# Patient Record
Sex: Female | Born: 1980 | Race: White | Hispanic: No | Marital: Single | Smoking: Former smoker
Health system: Southern US, Community
[De-identification: ages and names within clinical notes are randomized; demographics above are authoritative.]

## PROBLEM LIST (undated history)

## (undated) DIAGNOSIS — R87619 Unspecified abnormal cytological findings in specimens from cervix uteri: Secondary | ICD-10-CM

## (undated) DIAGNOSIS — IMO0002 Reserved for concepts with insufficient information to code with codable children: Secondary | ICD-10-CM

## (undated) HISTORY — PX: DILATION AND CURETTAGE OF UTERUS: SHX78

## (undated) HISTORY — PX: WISDOM TOOTH EXTRACTION: SHX21

## (undated) HISTORY — PX: ESOPHAGOGASTRODUODENOSCOPY ENDOSCOPY: SHX5814

---
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
## 2005-05-22 ENCOUNTER — Emergency Department (HOSPITAL_COMMUNITY): Admission: EM | Admit: 2005-05-22 | Discharge: 2005-05-22 | Payer: Self-pay | Admitting: Emergency Medicine

## 2005-06-23 ENCOUNTER — Emergency Department (HOSPITAL_COMMUNITY): Admission: EM | Admit: 2005-06-23 | Discharge: 2005-06-23 | Payer: Self-pay | Admitting: Emergency Medicine

## 2006-06-23 ENCOUNTER — Emergency Department (HOSPITAL_COMMUNITY): Admission: EM | Admit: 2006-06-23 | Discharge: 2006-06-23 | Payer: Self-pay | Admitting: Emergency Medicine

## 2011-01-12 LAB — RPR: RPR: NONREACTIVE

## 2011-01-12 LAB — ANTIBODY SCREEN: Antibody Screen: NEGATIVE

## 2011-01-21 ENCOUNTER — Other Ambulatory Visit (HOSPITAL_COMMUNITY): Payer: Self-pay | Admitting: Obstetrics and Gynecology

## 2011-01-21 DIAGNOSIS — IMO0001 Reserved for inherently not codable concepts without codable children: Secondary | ICD-10-CM

## 2011-01-21 DIAGNOSIS — Z3682 Encounter for antenatal screening for nuchal translucency: Secondary | ICD-10-CM

## 2011-02-22 ENCOUNTER — Other Ambulatory Visit (HOSPITAL_COMMUNITY): Payer: Self-pay | Admitting: Obstetrics and Gynecology

## 2011-02-22 ENCOUNTER — Ambulatory Visit (HOSPITAL_COMMUNITY)
Admission: RE | Admit: 2011-02-22 | Discharge: 2011-02-22 | Disposition: A | Discharge status: Home / Self Care | Payer: Medicaid Other | Source: Ambulatory Visit | Attending: Obstetrics and Gynecology | Admitting: Obstetrics and Gynecology

## 2011-02-22 DIAGNOSIS — O351XX0 Maternal care for (suspected) chromosomal abnormality in fetus, not applicable or unspecified: Secondary | ICD-10-CM | POA: Insufficient documentation

## 2011-02-22 DIAGNOSIS — Z3682 Encounter for antenatal screening for nuchal translucency: Secondary | ICD-10-CM

## 2011-02-22 DIAGNOSIS — O3510X0 Maternal care for (suspected) chromosomal abnormality in fetus, unspecified, not applicable or unspecified: Secondary | ICD-10-CM | POA: Insufficient documentation

## 2011-02-22 DIAGNOSIS — O30009 Twin pregnancy, unspecified number of placenta and unspecified number of amniotic sacs, unspecified trimester: Secondary | ICD-10-CM | POA: Insufficient documentation

## 2011-02-22 DIAGNOSIS — O3440 Maternal care for other abnormalities of cervix, unspecified trimester: Secondary | ICD-10-CM | POA: Insufficient documentation

## 2011-02-22 DIAGNOSIS — IMO0001 Reserved for inherently not codable concepts without codable children: Secondary | ICD-10-CM

## 2011-02-22 DIAGNOSIS — O99330 Smoking (tobacco) complicating pregnancy, unspecified trimester: Secondary | ICD-10-CM | POA: Insufficient documentation

## 2011-02-22 DIAGNOSIS — O2620 Pregnancy care for patient with recurrent pregnancy loss, unspecified trimester: Secondary | ICD-10-CM | POA: Insufficient documentation

## 2011-02-22 DIAGNOSIS — Z8751 Personal history of pre-term labor: Secondary | ICD-10-CM | POA: Insufficient documentation

## 2011-03-11 ENCOUNTER — Other Ambulatory Visit (HOSPITAL_COMMUNITY): Payer: Self-pay | Admitting: Obstetrics and Gynecology

## 2011-03-11 DIAGNOSIS — IMO0002 Reserved for concepts with insufficient information to code with codable children: Secondary | ICD-10-CM

## 2011-03-11 DIAGNOSIS — Z0489 Encounter for examination and observation for other specified reasons: Secondary | ICD-10-CM

## 2011-03-15 ENCOUNTER — Ambulatory Visit (HOSPITAL_COMMUNITY)
Admission: RE | Admit: 2011-03-15 | Discharge: 2011-03-15 | Disposition: A | Discharge status: Home / Self Care | Payer: Medicaid Other | Source: Ambulatory Visit | Attending: Obstetrics and Gynecology | Admitting: Obstetrics and Gynecology

## 2011-03-15 DIAGNOSIS — O30009 Twin pregnancy, unspecified number of placenta and unspecified number of amniotic sacs, unspecified trimester: Secondary | ICD-10-CM | POA: Insufficient documentation

## 2011-03-29 ENCOUNTER — Ambulatory Visit (HOSPITAL_COMMUNITY): Payer: Medicaid Other

## 2011-03-29 ENCOUNTER — Other Ambulatory Visit (HOSPITAL_COMMUNITY): Payer: Self-pay | Admitting: Obstetrics and Gynecology

## 2011-03-29 ENCOUNTER — Ambulatory Visit (HOSPITAL_COMMUNITY)
Admission: RE | Admit: 2011-03-29 | Discharge: 2011-03-29 | Disposition: A | Discharge status: Home / Self Care | Payer: Medicaid Other | Source: Ambulatory Visit | Attending: Obstetrics and Gynecology | Admitting: Obstetrics and Gynecology

## 2011-03-29 DIAGNOSIS — O3440 Maternal care for other abnormalities of cervix, unspecified trimester: Secondary | ICD-10-CM | POA: Insufficient documentation

## 2011-03-29 DIAGNOSIS — O99330 Smoking (tobacco) complicating pregnancy, unspecified trimester: Secondary | ICD-10-CM | POA: Insufficient documentation

## 2011-03-29 DIAGNOSIS — Z0489 Encounter for examination and observation for other specified reasons: Secondary | ICD-10-CM

## 2011-03-29 DIAGNOSIS — O358XX0 Maternal care for other (suspected) fetal abnormality and damage, not applicable or unspecified: Secondary | ICD-10-CM | POA: Insufficient documentation

## 2011-03-29 DIAGNOSIS — O2620 Pregnancy care for patient with recurrent pregnancy loss, unspecified trimester: Secondary | ICD-10-CM | POA: Insufficient documentation

## 2011-03-29 DIAGNOSIS — Z1389 Encounter for screening for other disorder: Secondary | ICD-10-CM | POA: Insufficient documentation

## 2011-03-29 DIAGNOSIS — IMO0002 Reserved for concepts with insufficient information to code with codable children: Secondary | ICD-10-CM

## 2011-03-29 DIAGNOSIS — Z8751 Personal history of pre-term labor: Secondary | ICD-10-CM | POA: Insufficient documentation

## 2011-03-29 DIAGNOSIS — Z363 Encounter for antenatal screening for malformations: Secondary | ICD-10-CM | POA: Insufficient documentation

## 2011-03-29 DIAGNOSIS — O30009 Twin pregnancy, unspecified number of placenta and unspecified number of amniotic sacs, unspecified trimester: Secondary | ICD-10-CM | POA: Insufficient documentation

## 2011-04-01 ENCOUNTER — Ambulatory Visit (HOSPITAL_COMMUNITY): Payer: Medicaid Other

## 2011-05-10 ENCOUNTER — Ambulatory Visit (HOSPITAL_COMMUNITY)
Admission: RE | Admit: 2011-05-10 | Discharge: 2011-05-10 | Disposition: A | Discharge status: Home / Self Care | Payer: Medicaid Other | Source: Ambulatory Visit | Attending: Obstetrics and Gynecology | Admitting: Obstetrics and Gynecology

## 2011-05-10 ENCOUNTER — Other Ambulatory Visit (HOSPITAL_COMMUNITY): Payer: Self-pay | Admitting: Obstetrics and Gynecology

## 2011-05-10 DIAGNOSIS — Z8751 Personal history of pre-term labor: Secondary | ICD-10-CM | POA: Insufficient documentation

## 2011-05-10 DIAGNOSIS — Z0489 Encounter for examination and observation for other specified reasons: Secondary | ICD-10-CM

## 2011-05-10 DIAGNOSIS — O2620 Pregnancy care for patient with recurrent pregnancy loss, unspecified trimester: Secondary | ICD-10-CM | POA: Insufficient documentation

## 2011-05-10 DIAGNOSIS — IMO0002 Reserved for concepts with insufficient information to code with codable children: Secondary | ICD-10-CM

## 2011-05-10 DIAGNOSIS — O99330 Smoking (tobacco) complicating pregnancy, unspecified trimester: Secondary | ICD-10-CM | POA: Insufficient documentation

## 2011-05-10 DIAGNOSIS — O30009 Twin pregnancy, unspecified number of placenta and unspecified number of amniotic sacs, unspecified trimester: Secondary | ICD-10-CM | POA: Insufficient documentation

## 2011-06-07 ENCOUNTER — Ambulatory Visit (HOSPITAL_COMMUNITY)
Admission: RE | Admit: 2011-06-07 | Discharge: 2011-06-07 | Disposition: A | Discharge status: Home / Self Care | Payer: Medicaid Other | Source: Ambulatory Visit | Attending: Obstetrics and Gynecology | Admitting: Obstetrics and Gynecology

## 2011-06-07 ENCOUNTER — Encounter (HOSPITAL_COMMUNITY): Payer: Self-pay

## 2011-06-07 VITALS — BP 101/68 | HR 98 | Wt 184.0 lb

## 2011-06-07 DIAGNOSIS — Z0489 Encounter for examination and observation for other specified reasons: Secondary | ICD-10-CM

## 2011-06-07 DIAGNOSIS — O2620 Pregnancy care for patient with recurrent pregnancy loss, unspecified trimester: Secondary | ICD-10-CM | POA: Insufficient documentation

## 2011-06-07 DIAGNOSIS — O30009 Twin pregnancy, unspecified number of placenta and unspecified number of amniotic sacs, unspecified trimester: Secondary | ICD-10-CM | POA: Insufficient documentation

## 2011-06-07 DIAGNOSIS — IMO0002 Reserved for concepts with insufficient information to code with codable children: Secondary | ICD-10-CM

## 2011-06-07 NOTE — Progress Notes (Signed)
See ultrasound report in AS  Vital signs reviewed 

## 2011-07-05 ENCOUNTER — Ambulatory Visit (HOSPITAL_COMMUNITY): Payer: Medicaid Other

## 2011-07-05 ENCOUNTER — Other Ambulatory Visit (HOSPITAL_COMMUNITY): Payer: Self-pay | Admitting: Maternal and Fetal Medicine

## 2011-07-05 ENCOUNTER — Ambulatory Visit (HOSPITAL_COMMUNITY)
Admission: RE | Admit: 2011-07-05 | Discharge: 2011-07-05 | Disposition: A | Discharge status: Home / Self Care | Payer: Medicaid Other | Source: Ambulatory Visit | Attending: Obstetrics and Gynecology | Admitting: Obstetrics and Gynecology

## 2011-07-05 VITALS — BP 102/69 | HR 90 | Wt 186.0 lb

## 2011-07-05 DIAGNOSIS — O30009 Twin pregnancy, unspecified number of placenta and unspecified number of amniotic sacs, unspecified trimester: Secondary | ICD-10-CM | POA: Insufficient documentation

## 2011-07-05 DIAGNOSIS — IMO0002 Reserved for concepts with insufficient information to code with codable children: Secondary | ICD-10-CM

## 2011-07-05 DIAGNOSIS — Z0489 Encounter for examination and observation for other specified reasons: Secondary | ICD-10-CM

## 2011-07-05 DIAGNOSIS — O99330 Smoking (tobacco) complicating pregnancy, unspecified trimester: Secondary | ICD-10-CM | POA: Insufficient documentation

## 2011-07-05 DIAGNOSIS — O2620 Pregnancy care for patient with recurrent pregnancy loss, unspecified trimester: Secondary | ICD-10-CM | POA: Insufficient documentation

## 2011-07-05 DIAGNOSIS — Z8751 Personal history of pre-term labor: Secondary | ICD-10-CM | POA: Insufficient documentation

## 2011-07-05 NOTE — Progress Notes (Signed)
Vital signs reviewed; see ultrasound report 

## 2011-07-11 ENCOUNTER — Encounter (HOSPITAL_COMMUNITY): Payer: Self-pay

## 2011-07-11 ENCOUNTER — Inpatient Hospital Stay (HOSPITAL_COMMUNITY)
Admission: AD | Admit: 2011-07-11 | Discharge: 2011-07-12 | DRG: 778 | Disposition: A | Discharge status: Home / Self Care | Payer: Medicaid Other | Source: Ambulatory Visit | Attending: Obstetrics and Gynecology | Admitting: Obstetrics and Gynecology

## 2011-07-11 ENCOUNTER — Inpatient Hospital Stay (HOSPITAL_COMMUNITY): Payer: Medicaid Other

## 2011-07-11 DIAGNOSIS — O4700 False labor before 37 completed weeks of gestation, unspecified trimester: Principal | ICD-10-CM | POA: Diagnosis present

## 2011-07-11 DIAGNOSIS — O30009 Twin pregnancy, unspecified number of placenta and unspecified number of amniotic sacs, unspecified trimester: Secondary | ICD-10-CM

## 2011-07-11 DIAGNOSIS — O30049 Twin pregnancy, dichorionic/diamniotic, unspecified trimester: Secondary | ICD-10-CM

## 2011-07-11 HISTORY — DX: Unspecified abnormal cytological findings in specimens from cervix uteri: R87.619

## 2011-07-11 HISTORY — DX: Reserved for concepts with insufficient information to code with codable children: IMO0002

## 2011-07-11 LAB — RAPID URINE DRUG SCREEN, HOSP PERFORMED
Benzodiazepines: NOT DETECTED
Cocaine: NOT DETECTED
Opiates: NOT DETECTED

## 2011-07-11 LAB — CBC
HCT: 31 % — ABNORMAL LOW (ref 36.0–46.0)
Hemoglobin: 10.5 g/dL — ABNORMAL LOW (ref 12.0–15.0)
MCHC: 33.9 g/dL (ref 30.0–36.0)
MCV: 87.8 fL (ref 78.0–100.0)
RDW: 13.9 % (ref 11.5–15.5)

## 2011-07-11 LAB — URINALYSIS, ROUTINE W REFLEX MICROSCOPIC
Leukocytes, UA: NEGATIVE
Nitrite: NEGATIVE
Specific Gravity, Urine: 1.015 (ref 1.005–1.030)
pH: 6 (ref 5.0–8.0)

## 2011-07-11 LAB — WET PREP, GENITAL: Trich, Wet Prep: NONE SEEN

## 2011-07-11 MED ORDER — LACTATED RINGERS IV SOLN
Freq: Once | INTRAVENOUS | Status: AC
  Administered 2011-07-11: 05:00:00 via INTRAVENOUS

## 2011-07-11 MED ORDER — ACETAMINOPHEN 325 MG PO TABS
650.0000 mg | ORAL_TABLET | Freq: Once | ORAL | Status: AC | PRN
  Administered 2011-07-11: 650 mg via ORAL
  Filled 2011-07-11: qty 2

## 2011-07-11 MED ORDER — ACETAMINOPHEN 325 MG PO TABS: 650.0000 mg | ORAL_TABLET | ORAL | Status: DC | PRN

## 2011-07-11 MED ORDER — ZOLPIDEM TARTRATE 10 MG PO TABS
10.0000 mg | ORAL_TABLET | Freq: Every evening | ORAL | Status: DC | PRN
  Administered 2011-07-11: 10 mg via ORAL
  Filled 2011-07-11: qty 1

## 2011-07-11 MED ORDER — CALCIUM CARBONATE ANTACID 500 MG PO CHEW: 2.0000 | CHEWABLE_TABLET | ORAL | Status: DC | PRN

## 2011-07-11 MED ORDER — NIFEDIPINE 10 MG PO CAPS: 20.0000 mg | ORAL_CAPSULE | Freq: Once | ORAL | Status: DC

## 2011-07-11 MED ORDER — NIFEDIPINE 10 MG PO CAPS
10.0000 mg | ORAL_CAPSULE | Freq: Four times a day (QID) | ORAL | Status: DC
  Administered 2011-07-11: 20 mg via ORAL
  Filled 2011-07-11: qty 2

## 2011-07-11 MED ORDER — BETAMETHASONE SOD PHOS & ACET 6 (3-3) MG/ML IJ SUSP
12.0000 mg | INTRAMUSCULAR | Status: AC
  Administered 2011-07-11 – 2011-07-12 (×2): 12 mg via INTRAMUSCULAR
  Filled 2011-07-11 (×2): qty 2

## 2011-07-11 MED ORDER — DOCUSATE SODIUM 100 MG PO CAPS
100.0000 mg | ORAL_CAPSULE | Freq: Every day | ORAL | Status: DC
  Administered 2011-07-12: 100 mg via ORAL
  Filled 2011-07-11: qty 1

## 2011-07-11 MED ORDER — LACTATED RINGERS IV SOLN
INTRAVENOUS | Status: DC
  Administered 2011-07-11 – 2011-07-12 (×3): via INTRAVENOUS

## 2011-07-11 MED ORDER — PRENATAL PLUS 27-1 MG PO TABS
1.0000 | ORAL_TABLET | Freq: Every day | ORAL | Status: DC
  Administered 2011-07-11 – 2011-07-12 (×2): 1 via ORAL
  Filled 2011-07-11 (×2): qty 1

## 2011-07-11 MED ORDER — NIFEDIPINE 10 MG PO CAPS: 10.0000 mg | ORAL_CAPSULE | Freq: Four times a day (QID) | ORAL | Status: DC

## 2011-07-11 NOTE — Progress Notes (Signed)
S: Feels better. Tired.  O: BP 106/66  Pulse 88  Temp(Src) 97.5 F (36.4 C) (Oral)  Resp 18  Ht 5\' 9"  (1.753 m)  Wt 83.008 kg (183 lb)  BMI 27.02 kg/m2  LMP 11/25/2010  [place]: Breech/ vertex;  Nl dopplers; nl fluid; Cx 1.78 cm  FHT: Cat 1 for both, contractions have spaced out  Abd: Nontender  P: Will continue obs in the hospital.  Will give the second dose of BMeth at 7:30 a.m. Tomorrow. Will do a fFN in a.m.  Patient will consider bedrest if she can go home tomorrow.

## 2011-07-11 NOTE — H&P (Signed)
Chief Complaint  Patient presents with  . Contractions   HPI Pt is a 30 y.o. Who presents with c/o uterine contractions every for the past 2 hrs.  Denies ROM or bldg.  Reports normal fetal movement.  States she also had contractions last evening as well.  Reports intercourse earlier this evening.  Currently preg with twin gestation.  Prev hx remarkable for previous preterm delivery x 2, singleton at 31wks and twin preg at 35wks.  Transferring to [facility] and had initial RN interview last week but has not seen provider yet.  Not rxed progesterone during this pregnancy.  Pt has had multiple ultrasounds with MFM at [facility], most recent being 07/05/11 which revealed Twin A breech with nml amniotic fluid, growth at the 24th% and umbilical artery doppler S/D of 4.04/96%ile and Twin B vertex with nml amniotic fluid, growth at the 45th% and umbilical artery doppler S/D of 3.55/88%. 12% discordance.  States she has not had a cervical exam.    OB History    Grav Para Term Preterm Abortions TAB SAB Ect Mult Living   12 2  2   0 9 0 1 3      Past Medical History  Diagnosis Date  . No pertinent past medical history   . Abnormal Pap smear     Past Surgical History  Procedure Date  . Wisdom tooth extraction   . Dilation and curettage of uterus     11weeks demise    No family history on file.  History  Substance Use Topics  . Smoking status: Current Everyday Smoker -- 0.2 packs/day    Types: Cigarettes  . Smokeless tobacco: Not on file  . Alcohol Use: No    Allergies:  Allergies  Allergen Reactions  . Penicillins Hives  . Sulfa Antibiotics Other (See Comments)    Childhood allergy; reaction unknown    Prescriptions prior to admission  Medication Sig Dispense Refill  . flintstones complete (FLINTSTONES) 60 MG chewable tablet Chew 2 tablets by mouth daily.        Marland Kitchen acetaminophen (TYLENOL) 325 MG tablet Take 650 mg by mouth every 6 (six) hours as needed.        . Multiple  Vitamin (MULTIVITAMIN) tablet Take 1 tablet by mouth daily.          Review of Systems  Constitutional: Negative.   HENT: Negative.   Eyes: Negative.   Respiratory: Negative.   Cardiovascular: Negative.   Gastrointestinal: Negative.   Genitourinary: Negative.   Musculoskeletal: Negative.   Skin: Negative.   Neurological: Negative.   Endo/Heme/Allergies: Negative.   Psychiatric/Behavioral: Negative.    Physical Exam   Blood pressure 111/67, pulse 109, temperature 98.3 F (36.8 C), temperature source Oral, resp. rate 20, height 5\' 9"  (1.753 m), weight 83.235 kg (183 lb 8 oz), last menstrual period 11/25/2010.  Physical Exam  Constitutional: She is oriented to person, place, and time. She appears well-developed and well-nourished.  HENT:  Head: Normocephalic and atraumatic.  Right Ear: External ear normal.  Left Ear: External ear normal.  Nose: Nose normal.  Eyes: Pupils are equal, round, and reactive to light.  Neck: Normal range of motion. Neck supple. No thyromegaly present.  Cardiovascular: Normal rate, regular rhythm, normal heart sounds and intact distal pulses.        Neg edema  Respiratory: Effort normal and breath sounds normal.  GI: Soft. Bowel sounds are normal.  Genitourinary: Vagina normal and uterus normal.  Musculoskeletal: Normal  range of motion.  Neurological: She is alert and oriented to person, place, and time. She has normal reflexes.  Skin: Skin is warm and dry.  Psychiatric: She has a normal mood and affect. Her behavior is normal. Judgment and thought content normal.   Twin A baseline FHR 135 with moderate variability present, accels present.  No decels present.  FHR Cat I Twin B baseline FHR 145 with moderate variability present, accels present.  No decels present.  FHR Cat I UCs every 3-7 minutes and mild to palpation, lasting approximately 30-60 seconds.  Ext genitalia: without lesions Vagina: Pink with nml rugae and tone.  Sm amt white d/c in  vault.  No bleeding noted. Cx: Posterior but does not appear friable. Uterus:  Gravid, soft, nontender. SVE:  3cm dilated/50% effaced/PP out of pelvis Brief bedside ultrasound to determine fetal position with Twin A on maternal left, breech presentation.  Twin B maternal right and transverse lie.     MAU Course  Procedures GC/Chl GBS cultures Wet prep U/A  Assessment and Plan  Twin IUP at 32 weeks 4 days Preterm contractions Hx preterm delivery x 2 Hx recurrent losses  Consult with Dr. Stefano Gaul Admit to Antepartum for observation. Course of betamethasone for fetal lung maturity. Ultrasound for BPPs/dopplers and cervical length.

## 2011-07-11 NOTE — ED Provider Notes (Signed)
History     Chief Complaint  Patient presents with  . Contractions   HPI Pt is a 30 y.o. Who presents with c/o uterine contractions every for the past 2 hrs.  Denies ROM or bldg.  Reports normal fetal movement.  States she also had contractions last evening as well.  Reports intercourse earlier this evening.  Currently preg with twin gestation.  Prev hx remarkable for previous preterm delivery x 2, singleton at 31wks and twin preg at 35wks.  Transferring to [facility] and had initial RN interview last week but has not seen provider yet.  Not rxed progesterone during this pregnancy.  Pt has had multiple ultrasounds with MFM at [facility], most recent being 07/05/11 which revealed Twin A breech with nml amniotic fluid, growth at the 24th% and umbilical artery doppler S/D of 4.04/96%ile and Twin B vertex with nml amniotic fluid, growth at the 45th% and umbilical artery doppler S/D of 3.55/88%. 12% discordance.  States she has not had a cervical exam.    OB History    Grav Para Term Preterm Abortions TAB SAB Ect Mult Living   8 2  2 5  0 5 0 1 3      Past Medical History  Diagnosis Date  . No pertinent past medical history   . Abnormal Pap smear     Past Surgical History  Procedure Date  . Wisdom tooth extraction   . Dilation and curettage of uterus     11weeks demise    No family history on file.  History  Substance Use Topics  . Smoking status: Current Everyday Smoker -- 0.2 packs/day    Types: Cigarettes  . Smokeless tobacco: Not on file  . Alcohol Use: No    Allergies:  Allergies  Allergen Reactions  . Penicillins Hives  . Sulfa Antibiotics Other (See Comments)    Childhood allergy; reaction unknown    Prescriptions prior to admission  Medication Sig Dispense Refill  . flintstones complete (FLINTSTONES) 60 MG chewable tablet Chew 2 tablets by mouth daily.        Marland Kitchen acetaminophen (TYLENOL) 325 MG tablet Take 650 mg by mouth every 6 (six) hours as needed.        .  Multiple Vitamin (MULTIVITAMIN) tablet Take 1 tablet by mouth daily.          Review of Systems  Constitutional: Negative.   HENT: Negative.   Eyes: Negative.   Respiratory: Negative.   Cardiovascular: Negative.   Gastrointestinal: Negative.   Genitourinary: Negative.   Musculoskeletal: Negative.   Skin: Negative.   Neurological: Negative.   Endo/Heme/Allergies: Negative.   Psychiatric/Behavioral: Negative.    Physical Exam   Blood pressure 111/67, pulse 109, temperature 98.3 F (36.8 C), temperature source Oral, resp. rate 20, height 5\' 9"  (1.753 m), weight 83.235 kg (183 lb 8 oz), last menstrual period 11/25/2010.  Physical Exam  Constitutional: She is oriented to person, place, and time. She appears well-developed and well-nourished.  HENT:  Head: Normocephalic and atraumatic.  Right Ear: External ear normal.  Left Ear: External ear normal.  Nose: Nose normal.  Eyes: Pupils are equal, round, and reactive to light.  Neck: Normal range of motion. Neck supple. No thyromegaly present.  Cardiovascular: Normal rate, regular rhythm, normal heart sounds and intact distal pulses.        Neg edema  Respiratory: Effort normal and breath sounds normal.  GI: Soft. Bowel sounds are normal.  Genitourinary: Vagina normal and uterus normal.  Musculoskeletal: Normal range of motion.  Neurological: She is alert and oriented to person, place, and time. She has normal reflexes.  Skin: Skin is warm and dry.  Psychiatric: She has a normal mood and affect. Her behavior is normal. Judgment and thought content normal.   Twin A baseline FHR 135 with moderate variability present, accels present.  No decels present.  FHR Cat I Twin B baseline FHR 145 with moderate variability present, accels present.  No decels present.  FHR Cat I UCs every 3-7 minutes and mild to palpation, lasting approximately 30-60 seconds.  Ext genitalia: without lesions Vagina: Pink with nml rugae and tone.  Sm amt white  d/c in vault.  No bleeding noted. Cx: Posterior but does not appear friable. Uterus:  Gravid, soft, nontender. SVE:  3cm dilated/50% effaced/PP out of pelvis Brief bedside ultrasound to determine fetal position with Twin A on maternal left, breech presentation.  Twin B maternal right and transverse lie.     MAU Course  Procedures GC/Chl GBS cultures Wet prep U/A  Assessment and Plan  Twin IUP at 32 weeks 4 days Preterm contractions Hx preterm delivery x 2 Hx recurrent losses  Consult with Dr. Stefano Gaul Admit to Antepartum for observation. Course of betamethasone for fetal lung maturity. Ultrasound for BPPs/dopplers and cervical length.     Linell Meldrum O. 07/11/2011, 5:54 AM

## 2011-07-11 NOTE — Progress Notes (Signed)
Patient is here with c/o contractions that started at 12:45am. She states that she recently changed OB practices [place]. She denies any vaginal bleeding, lof or discharge.

## 2011-07-11 NOTE — Progress Notes (Signed)
S: Doing well. Few contractions. O: Abd: Nontender BP 116/70  Pulse 98  Temp(Src) 97.9 F (36.6 C) (Oral)  Resp 18  Ht 5\' 9"  (1.753 m)  Wt 83.008 kg (183 lb)  BMI 27.02 kg/m2  LMP 11/25/2010  NST: Cat 1 for both  A: Stable  P: BMeth in the morning. fFN in the morning.  NICU consult.

## 2011-07-12 DIAGNOSIS — O30009 Twin pregnancy, unspecified number of placenta and unspecified number of amniotic sacs, unspecified trimester: Secondary | ICD-10-CM

## 2011-07-12 MED ORDER — NIFEDIPINE 10 MG PO CAPS: 10.0000 mg | ORAL_CAPSULE | Freq: Four times a day (QID) | ORAL | Status: DC

## 2011-07-12 NOTE — Progress Notes (Signed)
   Hospital day # 1 twin pregnancy at [redacted]w[redacted]d  S: well, reports good fetal activity      Contractions:none      Vaginal bleeding:none now       Vaginal discharge: no significant change  O: BP 107/62  Pulse 98  Temp(Src) 97.7 F (36.5 C) (Axillary)  Resp 18  Ht 5\' 9"  (1.753 m)  Wt 83.008 kg (183 lb)  BMI 27.02 kg/m2  LMP 11/25/2010      Fetal tracings:reviewed and reassuring      Uterus gravid and non-tender      Extremities: no significant edema and no signs of DVT      VE: 1+/ long/ post/ firm/ high  A: [redacted]w[redacted]d with preterm labor     completely resolved  P: FFN collected      Discharge home on bed rest with Procardia PRN      Keep scheduled appointment Wednesday 07/14/11  [facility] A  MD 07/12/2011 11:47 AM

## 2011-07-12 NOTE — Progress Notes (Signed)
UR chart review completed.  

## 2011-07-12 NOTE — Discharge Summary (Signed)
  ANTENATAL DISCHARGE SUMMARY  Patient ID: Gwendolyn Glenn MRN: [id number] DOB/AGE: January 02, 1981 29 y.o.  Admit date: 07/11/2011 Discharge date: 07/12/2011  Admission Diagnoses: 32wks twin pregnancy with preterm labor  Discharge Diagnoses: same        Discharged Condition: good  Hospital Course: Received Procardia PRN and responded rapidly. Completed steroid course on 07/12/11.  Consults: none and MFM  Treatments: steroids  Disposition: home on full bedrest.   Current Discharge Medication List    START taking these medications   Details  NIFEdipine (PROCARDIA) 10 MG capsule Take 1 capsule (10 mg total) by mouth every 6 (six) hours. Take only as needed if >= 5 contractions per hour Qty: 30 capsule, Refills: 0      CONTINUE these medications which have NOT CHANGED   Details  flintstones complete (FLINTSTONES) 60 MG chewable tablet Chew 2 tablets by mouth daily.      acetaminophen (TYLENOL) 325 MG tablet Take 650 mg by mouth every 6 (six) hours as needed.      Multiple Vitamin (MULTIVITAMIN) tablet Take 1 tablet by mouth daily.           Signed: Esmeralda Arthur, MD MD 07/12/2011, 11:53 AM

## 2011-07-13 LAB — CULTURE, BETA STREP (GROUP B ONLY)

## 2011-07-28 ENCOUNTER — Ambulatory Visit (HOSPITAL_COMMUNITY): Payer: Medicaid Other

## 2011-07-28 ENCOUNTER — Other Ambulatory Visit: Payer: Self-pay | Admitting: Obstetrics and Gynecology

## 2011-08-03 ENCOUNTER — Encounter (HOSPITAL_COMMUNITY): Payer: Self-pay | Admitting: *Deleted

## 2011-08-03 NOTE — Patient Instructions (Signed)
   Your procedure is scheduled on: Thursday Enter through the Main Entrance of [facility] at: 12:30pm Pick up the phone at the desk and dial [phone]  Please call this number if you have any problems the morning of surgery: [phone]  Remember: Do not eat food after midnight  Do not drink clear liquids after:10am Take these medicines the morning of surgery with a SIP OF WATER:  Do not wear jewelry, make-up, or FINGER nail polish Do not wear lotions, powders, or perfumes. Do not shave 48 hours prior to surgery. Do not bring valuables to the hospital. Leave suitcase in the car. After Surgery it may be brought to your room. For patients being admitted to the hospital, checkout time is 11:00am the day of discharge.  Patients discharged on the day of surgery will not be allowed to drive home.   Name and phone number of your driver:   Remember to use your hibiclens as instructed.

## 2011-08-04 MED ORDER — GENTAMICIN SULFATE 40 MG/ML IJ SOLN
INTRAVENOUS | Status: DC
  Filled 2011-08-04: qty 2.5

## 2011-08-05 ENCOUNTER — Inpatient Hospital Stay (HOSPITAL_COMMUNITY)
Admission: RE | Admit: 2011-08-05 | Discharge: 2011-08-07 | DRG: 765 | Disposition: A | Discharge status: Home / Self Care | Payer: Medicaid Other | Source: Ambulatory Visit | Attending: Obstetrics and Gynecology | Admitting: Obstetrics and Gynecology

## 2011-08-05 ENCOUNTER — Encounter (HOSPITAL_COMMUNITY): Payer: Self-pay | Admitting: *Deleted

## 2011-08-05 ENCOUNTER — Encounter (HOSPITAL_COMMUNITY): Payer: Self-pay | Admitting: Anesthesiology

## 2011-08-05 ENCOUNTER — Encounter (HOSPITAL_COMMUNITY)
Admission: RE | Disposition: A | Discharge status: Home / Self Care | Payer: Self-pay | Source: Ambulatory Visit | Attending: Obstetrics and Gynecology

## 2011-08-05 ENCOUNTER — Inpatient Hospital Stay (HOSPITAL_COMMUNITY): Payer: Medicaid Other | Admitting: Anesthesiology

## 2011-08-05 ENCOUNTER — Other Ambulatory Visit: Payer: Self-pay | Admitting: Obstetrics and Gynecology

## 2011-08-05 DIAGNOSIS — O30049 Twin pregnancy, dichorionic/diamniotic, unspecified trimester: Secondary | ICD-10-CM

## 2011-08-05 DIAGNOSIS — IMO0002 Reserved for concepts with insufficient information to code with codable children: Secondary | ICD-10-CM | POA: Diagnosis present

## 2011-08-05 DIAGNOSIS — O329XX0 Maternal care for malpresentation of fetus, unspecified, not applicable or unspecified: Principal | ICD-10-CM | POA: Diagnosis present

## 2011-08-05 DIAGNOSIS — O365990 Maternal care for other known or suspected poor fetal growth, unspecified trimester, not applicable or unspecified: Secondary | ICD-10-CM | POA: Diagnosis present

## 2011-08-05 DIAGNOSIS — O30009 Twin pregnancy, unspecified number of placenta and unspecified number of amniotic sacs, unspecified trimester: Secondary | ICD-10-CM | POA: Diagnosis present

## 2011-08-05 DIAGNOSIS — O3090 Multiple gestation, unspecified, unspecified trimester: Principal | ICD-10-CM | POA: Diagnosis present

## 2011-08-05 LAB — CBC
HCT: 33.9 % — ABNORMAL LOW (ref 36.0–46.0)
Hemoglobin: 11.3 g/dL — ABNORMAL LOW (ref 12.0–15.0)
MCHC: 33.3 g/dL (ref 30.0–36.0)
MCV: 85.8 fL (ref 78.0–100.0)
RDW: 14 % (ref 11.5–15.5)
WBC: 13.1 10*3/uL — ABNORMAL HIGH (ref 4.0–10.5)

## 2011-08-05 LAB — SURGICAL PCR SCREEN: MRSA, PCR: NEGATIVE

## 2011-08-05 SURGERY — Surgical Case
Anesthesia: Spinal | Site: Abdomen | Wound class: Clean Contaminated

## 2011-08-05 MED ORDER — IBUPROFEN 600 MG PO TABS
600.0000 mg | ORAL_TABLET | Freq: Four times a day (QID) | ORAL | Status: DC
  Administered 2011-08-05 – 2011-08-07 (×6): 600 mg via ORAL
  Filled 2011-08-05 (×6): qty 1

## 2011-08-05 MED ORDER — FENTANYL CITRATE 0.05 MG/ML IJ SOLN: 25.0000 ug | INTRAMUSCULAR | Status: DC | PRN

## 2011-08-05 MED ORDER — ONDANSETRON HCL 4 MG/2ML IJ SOLN: 4.0000 mg | INTRAMUSCULAR | Status: DC | PRN

## 2011-08-05 MED ORDER — LACTATED RINGERS IV SOLN
INTRAVENOUS | Status: DC | PRN
  Administered 2011-08-05: 12:00:00 via INTRAVENOUS

## 2011-08-05 MED ORDER — ZOLPIDEM TARTRATE 5 MG PO TABS: 5.0000 mg | ORAL_TABLET | Freq: Every evening | ORAL | Status: DC | PRN

## 2011-08-05 MED ORDER — PHENYLEPHRINE 40 MCG/ML (10ML) SYRINGE FOR IV PUSH (FOR BLOOD PRESSURE SUPPORT)
PREFILLED_SYRINGE | INTRAVENOUS | Status: AC
  Filled 2011-08-05: qty 5

## 2011-08-05 MED ORDER — NALBUPHINE HCL 10 MG/ML IJ SOLN
5.0000 mg | INTRAMUSCULAR | Status: DC | PRN
  Administered 2011-08-06: 10 mg via SUBCUTANEOUS

## 2011-08-05 MED ORDER — METHYLERGONOVINE MALEATE 0.2 MG/ML IJ SOLN: 0.2000 mg | INTRAMUSCULAR | Status: DC | PRN

## 2011-08-05 MED ORDER — ONDANSETRON HCL 4 MG/2ML IJ SOLN
INTRAMUSCULAR | Status: AC
  Filled 2011-08-05: qty 2

## 2011-08-05 MED ORDER — DIPHENHYDRAMINE HCL 25 MG PO CAPS: 25.0000 mg | ORAL_CAPSULE | ORAL | Status: DC | PRN

## 2011-08-05 MED ORDER — LANOLIN HYDROUS EX OINT: 1.0000 "application " | TOPICAL_OINTMENT | CUTANEOUS | Status: DC | PRN

## 2011-08-05 MED ORDER — KETOROLAC TROMETHAMINE 30 MG/ML IJ SOLN
30.0000 mg | Freq: Four times a day (QID) | INTRAMUSCULAR | Status: AC | PRN
  Administered 2011-08-05: 30 mg via INTRAVENOUS

## 2011-08-05 MED ORDER — SODIUM CHLORIDE 0.9 % IV SOLN
1.0000 ug/kg/h | INTRAVENOUS | Status: DC | PRN
  Filled 2011-08-05: qty 2.5

## 2011-08-05 MED ORDER — DIPHENHYDRAMINE HCL 25 MG PO CAPS: 25.0000 mg | ORAL_CAPSULE | Freq: Four times a day (QID) | ORAL | Status: DC | PRN

## 2011-08-05 MED ORDER — ONDANSETRON HCL 4 MG PO TABS: 4.0000 mg | ORAL_TABLET | ORAL | Status: DC | PRN

## 2011-08-05 MED ORDER — PRENATAL PLUS 27-1 MG PO TABS
1.0000 | ORAL_TABLET | Freq: Every day | ORAL | Status: DC
  Administered 2011-08-07: 1 via ORAL
  Filled 2011-08-05 (×2): qty 1

## 2011-08-05 MED ORDER — METHYLERGONOVINE MALEATE 0.2 MG PO TABS: 0.2000 mg | ORAL_TABLET | ORAL | Status: DC | PRN

## 2011-08-05 MED ORDER — DIPHENHYDRAMINE HCL 50 MG/ML IJ SOLN: 25.0000 mg | INTRAMUSCULAR | Status: DC | PRN

## 2011-08-05 MED ORDER — NALBUPHINE HCL 10 MG/ML IJ SOLN
5.0000 mg | INTRAMUSCULAR | Status: DC | PRN
  Filled 2011-08-05: qty 1

## 2011-08-05 MED ORDER — FENTANYL CITRATE 0.05 MG/ML IJ SOLN
INTRAMUSCULAR | Status: DC | PRN
  Administered 2011-08-05: 12.5 ug via INTRATHECAL

## 2011-08-05 MED ORDER — MORPHINE SULFATE 0.5 MG/ML IJ SOLN
INTRAMUSCULAR | Status: AC
  Filled 2011-08-05: qty 10

## 2011-08-05 MED ORDER — BUPIVACAINE IN DEXTROSE 0.75-8.25 % IT SOLN
INTRATHECAL | Status: DC | PRN
  Administered 2011-08-05: 1.6 mL via INTRATHECAL

## 2011-08-05 MED ORDER — MORPHINE SULFATE (PF) 0.5 MG/ML IJ SOLN
INTRAMUSCULAR | Status: DC | PRN
  Administered 2011-08-05: .2 mg via INTRATHECAL

## 2011-08-05 MED ORDER — LACTATED RINGERS IV SOLN
INTRAVENOUS | Status: DC
  Administered 2011-08-05 (×3): via INTRAVENOUS

## 2011-08-05 MED ORDER — SCOPOLAMINE 1 MG/3DAYS TD PT72: 1.0000 | MEDICATED_PATCH | Freq: Once | TRANSDERMAL | Status: DC

## 2011-08-05 MED ORDER — ONDANSETRON HCL 4 MG/2ML IJ SOLN: 4.0000 mg | Freq: Three times a day (TID) | INTRAMUSCULAR | Status: DC | PRN

## 2011-08-05 MED ORDER — NALOXONE HCL 0.4 MG/ML IJ SOLN: 0.4000 mg | INTRAMUSCULAR | Status: DC | PRN

## 2011-08-05 MED ORDER — FENTANYL CITRATE 0.05 MG/ML IJ SOLN
INTRAMUSCULAR | Status: AC
  Filled 2011-08-05: qty 2

## 2011-08-05 MED ORDER — ACETAMINOPHEN 325 MG PO TABS: 325.0000 mg | ORAL_TABLET | ORAL | Status: DC | PRN

## 2011-08-05 MED ORDER — ACETAMINOPHEN 10 MG/ML IV SOLN: 1000.0000 mg | Freq: Four times a day (QID) | INTRAVENOUS | Status: AC | PRN

## 2011-08-05 MED ORDER — DIPHENHYDRAMINE HCL 50 MG/ML IJ SOLN: 12.5000 mg | INTRAMUSCULAR | Status: DC | PRN

## 2011-08-05 MED ORDER — SENNOSIDES-DOCUSATE SODIUM 8.6-50 MG PO TABS
2.0000 | ORAL_TABLET | Freq: Every day | ORAL | Status: DC
  Administered 2011-08-05 – 2011-08-06 (×2): 2 via ORAL

## 2011-08-05 MED ORDER — WITCH HAZEL-GLYCERIN EX PADS: 1.0000 "application " | MEDICATED_PAD | CUTANEOUS | Status: DC | PRN

## 2011-08-05 MED ORDER — OXYTOCIN 20 UNITS IN LACTATED RINGERS INFUSION - SIMPLE
125.0000 mL/h | INTRAVENOUS | Status: AC
  Administered 2011-08-05: 125 mL/h via INTRAVENOUS
  Filled 2011-08-05: qty 1000

## 2011-08-05 MED ORDER — SIMETHICONE 80 MG PO CHEW
80.0000 mg | CHEWABLE_TABLET | ORAL | Status: DC | PRN
  Administered 2011-08-06: 80 mg via ORAL

## 2011-08-05 MED ORDER — IBUPROFEN 600 MG PO TABS: 600.0000 mg | ORAL_TABLET | Freq: Four times a day (QID) | ORAL | Status: DC | PRN

## 2011-08-05 MED ORDER — ONDANSETRON HCL 4 MG/2ML IJ SOLN
INTRAMUSCULAR | Status: DC | PRN
  Administered 2011-08-05: 4 mg via INTRAVENOUS

## 2011-08-05 MED ORDER — DIBUCAINE 1 % RE OINT: 1.0000 "application " | TOPICAL_OINTMENT | RECTAL | Status: DC | PRN

## 2011-08-05 MED ORDER — SCOPOLAMINE 1 MG/3DAYS TD PT72
MEDICATED_PATCH | TRANSDERMAL | Status: AC
  Administered 2011-08-05: 1.5 mg via TRANSDERMAL
  Filled 2011-08-05: qty 1

## 2011-08-05 MED ORDER — MUPIROCIN 2 % EX OINT
TOPICAL_OINTMENT | CUTANEOUS | Status: AC
  Administered 2011-08-05: 09:00:00 via NASAL
  Filled 2011-08-05: qty 22

## 2011-08-05 MED ORDER — OXYTOCIN 10 UNIT/ML IJ SOLN
INTRAMUSCULAR | Status: AC
  Filled 2011-08-05: qty 2

## 2011-08-05 MED ORDER — OXYCODONE-ACETAMINOPHEN 5-325 MG PO TABS
1.0000 | ORAL_TABLET | ORAL | Status: DC | PRN
  Administered 2011-08-05 – 2011-08-06 (×4): 1 via ORAL
  Administered 2011-08-06: 2 via ORAL
  Administered 2011-08-06 (×2): 1 via ORAL
  Administered 2011-08-07 (×3): 2 via ORAL
  Administered 2011-08-07: 1 via ORAL
  Filled 2011-08-05: qty 1
  Filled 2011-08-05 (×4): qty 2
  Filled 2011-08-05 (×4): qty 1
  Filled 2011-08-05: qty 2
  Filled 2011-08-05 (×2): qty 1

## 2011-08-05 MED ORDER — PHENYLEPHRINE HCL 10 MG/ML IJ SOLN
INTRAMUSCULAR | Status: DC | PRN
  Administered 2011-08-05: 80 ug via INTRAVENOUS
  Administered 2011-08-05: 120 ug via INTRAVENOUS

## 2011-08-05 MED ORDER — FERROUS SULFATE 325 (65 FE) MG PO TABS
325.0000 mg | ORAL_TABLET | Freq: Two times a day (BID) | ORAL | Status: DC
  Administered 2011-08-05 – 2011-08-07 (×3): 325 mg via ORAL
  Filled 2011-08-05 (×4): qty 1

## 2011-08-05 MED ORDER — TETANUS-DIPHTH-ACELL PERTUSSIS 5-2.5-18.5 LF-MCG/0.5 IM SUSP
0.5000 mL | Freq: Once | INTRAMUSCULAR | Status: AC
  Administered 2011-08-07: 0.5 mL via INTRAMUSCULAR
  Filled 2011-08-05: qty 0.5

## 2011-08-05 MED ORDER — PROMETHAZINE HCL 25 MG/ML IJ SOLN: 6.2500 mg | INTRAMUSCULAR | Status: DC | PRN

## 2011-08-05 MED ORDER — BUPIVACAINE HCL (PF) 0.25 % IJ SOLN
INTRAMUSCULAR | Status: DC | PRN
  Administered 2011-08-05: 20 mL

## 2011-08-05 MED ORDER — SIMETHICONE 80 MG PO CHEW
80.0000 mg | CHEWABLE_TABLET | Freq: Three times a day (TID) | ORAL | Status: DC
  Administered 2011-08-05 – 2011-08-07 (×7): 80 mg via ORAL

## 2011-08-05 MED ORDER — SCOPOLAMINE 1 MG/3DAYS TD PT72
1.0000 | MEDICATED_PATCH | Freq: Once | TRANSDERMAL | Status: DC
  Administered 2011-08-05: 1.5 mg via TRANSDERMAL

## 2011-08-05 MED ORDER — MENTHOL 3 MG MT LOZG: 1.0000 | LOZENGE | OROMUCOSAL | Status: DC | PRN

## 2011-08-05 MED ORDER — KETOROLAC TROMETHAMINE 30 MG/ML IJ SOLN: 30.0000 mg | Freq: Four times a day (QID) | INTRAMUSCULAR | Status: AC | PRN

## 2011-08-05 MED ORDER — MEPERIDINE HCL 25 MG/ML IJ SOLN: 6.2500 mg | INTRAMUSCULAR | Status: DC | PRN

## 2011-08-05 MED ORDER — SODIUM CHLORIDE 0.9 % IJ SOLN: 3.0000 mL | INTRAMUSCULAR | Status: DC | PRN

## 2011-08-05 MED ORDER — MEASLES, MUMPS & RUBELLA VAC ~~LOC~~ INJ: 0.5000 mL | INJECTION | Freq: Once | SUBCUTANEOUS | Status: DC

## 2011-08-05 MED ORDER — KETOROLAC TROMETHAMINE 30 MG/ML IJ SOLN
INTRAMUSCULAR | Status: AC
  Administered 2011-08-05: 30 mg via INTRAVENOUS
  Filled 2011-08-05: qty 1

## 2011-08-05 MED ORDER — OXYTOCIN 20 UNITS IN LACTATED RINGERS INFUSION - SIMPLE
INTRAVENOUS | Status: DC | PRN
  Administered 2011-08-05: 20 [IU] via INTRAVENOUS

## 2011-08-05 MED ORDER — METOCLOPRAMIDE HCL 5 MG/ML IJ SOLN: 10.0000 mg | Freq: Three times a day (TID) | INTRAMUSCULAR | Status: DC | PRN

## 2011-08-05 SURGICAL SUPPLY — 35 items
BENZOIN TINCTURE PRP APPL 2/3 (GAUZE/BANDAGES/DRESSINGS) ×2 IMPLANT
BOOTIES KNEE HIGH SLOAN (MISCELLANEOUS) ×4 IMPLANT
CHLORAPREP W/TINT 26ML (MISCELLANEOUS) ×2 IMPLANT
CLOTH BEACON ORANGE TIMEOUT ST (SAFETY) ×2 IMPLANT
DRAIN JACKSON PRT FLT 10 (DRAIN) IMPLANT
DRESSING TELFA 8X3 (GAUZE/BANDAGES/DRESSINGS) ×2 IMPLANT
ELECT REM PT RETURN 9FT ADLT (ELECTROSURGICAL) ×2
ELECTRODE REM PT RTRN 9FT ADLT (ELECTROSURGICAL) ×1 IMPLANT
EVACUATOR SILICONE 100CC (DRAIN) IMPLANT
EXTRACTOR VACUUM M CUP 4 TUBE (SUCTIONS) IMPLANT
GAUZE SPONGE 4X4 12PLY STRL LF (GAUZE/BANDAGES/DRESSINGS) ×4 IMPLANT
GLOVE BIOGEL PI IND STRL 7.0 (GLOVE) ×2 IMPLANT
GLOVE BIOGEL PI INDICATOR 7.0 (GLOVE) ×2
GLOVE ECLIPSE 6.5 STRL STRAW (GLOVE) ×2 IMPLANT
GOWN PREVENTION PLUS LG XLONG (DISPOSABLE) ×6 IMPLANT
KIT ABG SYR 3ML LUER SLIP (SYRINGE) IMPLANT
NEEDLE HYPO 22GX1.5 SAFETY (NEEDLE) ×2 IMPLANT
NEEDLE HYPO 25X5/8 SAFETYGLIDE (NEEDLE) IMPLANT
NS IRRIG 1000ML POUR BTL (IV SOLUTION) ×2 IMPLANT
PACK C SECTION WH (CUSTOM PROCEDURE TRAY) ×2 IMPLANT
PAD ABD 7.5X8 STRL (GAUZE/BANDAGES/DRESSINGS) ×2 IMPLANT
RTRCTR C-SECT PINK 25CM LRG (MISCELLANEOUS) ×2 IMPLANT
SLEEVE SCD COMPRESS KNEE MED (MISCELLANEOUS) IMPLANT
STRIP CLOSURE SKIN 1/2X4 (GAUZE/BANDAGES/DRESSINGS) ×2 IMPLANT
SUT CHROMIC GUT AB #0 18 (SUTURE) IMPLANT
SUT MNCRL AB 3-0 PS2 27 (SUTURE) ×2 IMPLANT
SUT SILK 2 0 FSL 18 (SUTURE) IMPLANT
SUT VIC AB 0 CTX 36 (SUTURE) ×3
SUT VIC AB 0 CTX36XBRD ANBCTRL (SUTURE) ×3 IMPLANT
SUT VIC AB 1 CT1 36 (SUTURE) ×4 IMPLANT
SYR 20CC LL (SYRINGE) ×2 IMPLANT
TOWEL OR 17X24 6PK STRL BLUE (TOWEL DISPOSABLE) ×4 IMPLANT
TRAY FOLEY BAG SILVER LF 14FR (CATHETERS) ×2 IMPLANT
TRAY FOLEY CATH 14FR (SET/KITS/TRAYS/PACK) IMPLANT
WATER STERILE IRR 1000ML POUR (IV SOLUTION) IMPLANT

## 2011-08-05 NOTE — Anesthesia Preprocedure Evaluation (Signed)
Anesthesia Evaluation  Name, MR# and DOB Patient awake  General Assessment Comment  Reviewed: Allergy & Precautions, H&P , NPO status , Patient's Chart, lab work & pertinent test results  Airway Mallampati: II TM Distance: >3 FB Neck ROM: full    Dental No notable dental hx.    Pulmonary  clear to auscultation  pulmonary exam normalPulmonary Exam Normal breath sounds clear to auscultation none    Cardiovascular     Neuro/Psych Negative Neurological ROS  Negative Psych ROS  GI/Hepatic/Renal negative GI ROS  negative Liver ROS  negative Renal ROS        Endo/Other  Negative Endocrine ROS (+)      Abdominal Normal abdominal exam  (+)   Musculoskeletal negative musculoskeletal ROS (+)   Hematology negative hematology ROS (+)   Peds  Reproductive/Obstetrics (+) Pregnancy    Anesthesia Other Findings             Anesthesia Physical Anesthesia Plan  ASA: II  Anesthesia Plan: Spinal   Post-op Pain Management:    Induction:   Airway Management Planned:   Additional Equipment:   Intra-op Plan:   Post-operative Plan:   Informed Consent: I have reviewed the patients History and Physical, chart, labs and discussed the procedure including the risks, benefits and alternatives for the proposed anesthesia with the patient or authorized representative who has indicated his/her understanding and acceptance.     Plan Discussed with:   Anesthesia Plan Comments:         Anesthesia Quick Evaluation

## 2011-08-05 NOTE — Consult Note (Signed)
Neonatology Note:   Attendance at C-section:    I was asked to attend this primary C/S at [redacted] weeks GA due to twin gestation with no growth in Twin A for past 2 weeks. The mother is a [id number] A neg, GBS unknwon with di-di twins, both breech presentation. ROM at delivery, fluid clear. Twin A delivered frank breech and cried spontaneously. He took a few minutes to pink up well. We did bulb suctioning and stimulation. Ap 7/9. His lungs were clear to ausc without distress in the OR. Twin B was delivered footling breech, cried spontaneously, and needed only minimal bulb suctioning. Ap 9/9. Her lungs were clear and she had no distress. To CN to care of Pediatrician.   Deatra James, MD

## 2011-08-05 NOTE — Anesthesia Procedure Notes (Signed)
Spinal Block  Patient location during procedure: OR Start time: 08/05/2011 11:15 AM End time: 08/05/2011 11:19 AM Staffing Anesthesiologist: Sandrea Hughs Performed by: anesthesiologist  Preanesthetic Checklist Completed: patient identified, site marked, surgical consent, pre-op evaluation, timeout performed, IV checked, risks and benefits discussed and monitors and equipment checked Spinal Block Patient position: sitting Prep: DuraPrep Patient monitoring: heart rate, cardiac monitor, continuous pulse ox and blood pressure Approach: midline Location: L3-4 Injection technique: single-shot Needle Needle type: Sprotte  Needle gauge: 24 G Needle length: 9 cm Needle insertion depth: 5 cm Assessment Sensory level: T4

## 2011-08-05 NOTE — Interval H&P Note (Signed)
History and Physical Interval Note:   08/05/2011   10:28 AM   Gwendolyn Glenn  has presented today for surgery, with the diagnosis of Twins; Breech/Vertex @ 37+ weeks  history of preterm deliveries 36 wks baby a iugr was oked by mfm  The various methods of treatment have been discussed with the patient and family. After consideration of risks, benefits and other options for treatment, the patient has consented to  Procedure(s): CESAREAN SECTION as a surgical intervention .  I have reviewed the patients' chart and labs.  Questions were answered to the patient's satisfaction.     Silverio Lay A  MD

## 2011-08-05 NOTE — Transfer of Care (Signed)
Immediate Anesthesia Transfer of Care Note  Patient: Gwendolyn Glenn  Procedure(s) Performed:  CESAREAN SECTION - Primary Cesarean Section with Delivery  Twins  Baby A Boy @ [phone], Baby B Girl @ 1144  Patient Location: PACU  Anesthesia Type: Spinal  Level of Consciousness: awake, alert  and oriented  Airway & Oxygen Therapy: Patient Spontanous Breathing  Post-op Assessment: Report given to PACU RN and Post -op Vital signs reviewed and stable  Post vital signs: Reviewed and stable  Complications: No apparent anesthesia complications

## 2011-08-05 NOTE — Op Note (Signed)
Preoperative diagnosis: Intrauterine twin pregnancy at 36 weeks and 1 days with IUGR on twin A  Post operative diagnosis: Same  Anesthesia: Spinal  Anesthesiologist: Dr. Arby Barrette  Procedure: Primary low transverse cesarean section  Surgeon: Dr. Dois Davenport Marua Qin  Assistant: [name] CNM  Estimated blood loss: 700 cc  Procedure:  After being informed of the planned procedure and possible complications including bleeding, infection, injury to other organs, informed consent is obtained. The patient is taken to OR #1 and given spinal anesthesia without complication. She is placed in the dorsal decubitus position with the pelvis tilted to the left. She is then prepped and draped in a sterile fashion. A Foley catheter is inserted in her bladder.  After assessing adequate level of anesthesia, we infiltrate the suprapubic area with 20 cc of Marcaine 0.25 and perform a Pfannenstiel incision which is brought down sharply to the fascia. The fascia is entered in a low transverse fashion. Linea alba is dissected. Peritoneum is entered in a midline fashion. An Alexis retractor is easily positioned. Visceral peritoneum is entered in a low transverse fashion allowing [place] to safely retract bladder by developing a bladder flap.  The myometrium is then entered in a low transverse fashion; first with knife and then extended bluntly. Amniotic fluid is clear. We assist the birth of a female  infant in frank breech presentation at 11:42 am. Mouth and nose are suctioned. The baby is delivered after reducing a double nuchal cord. The cord is clamped and sectioned. The baby is given to the neonatologist present in the room.We then assist the birth of a female  infant in footling breech presentation at 11:44 am. Mouth and nose are suctioned. The baby is delivered. The cord is clamped and sectioned after reducing a double nuchal cord. The baby is given to the neonatologist present in the room.    10 cc of blood is drawn  from the umbilical vein X 2.The 2 placentas are allowed to deliver spontaneously. Both arecomplete and both  cords have 3 vessels. Uterine revision is negative.  We proceed with closure of the myometrium in 2 layers: First with a running locked suture of 0 Vicryl, then with a Lembert suture of 0 Vicryl imbricating the first one. Hemostasis is completed with cauterization on peritoneal edges and 2 figure-of-eight sutures of 0-Vicryl.  Both paracolic gutters are cleaned. Both tubes and ovaries are assessed and normal. The pelvis is profusely irrigated with warm saline to confirm a satisfactory hemostasis.  Retractors and sponges are removed. Under fascia hemostasis is completed with cauterization. The fascia is then closed with 2 running sutures of 0 Vicryl meeting midline. The wound is irrigated with warm saline and hemostasis is completed with cauterization. The skin is closed with a subcuticular suture of 3-0 Monocryl and Steri-Strips.  Instrument and sponge count is complete x2. Estimated blood loss is 700 cc.  The procedure is well tolerated by the patient who is taken to recovery room in a well and stable condition.  Twin A was a female baby named Caryl Never was born at 11:42 am and received an Apgar of 7  at 1 minute and 9 at 5 minutes. Weight was 4 lbs  15 oz.  Twin B was a female baby named Piper was born at 11:44 am and received an Apgar of 9  at 1 minute and 9 at 5 minutes. Weight was 5 lbs.   Specimen: Placenta X 2 sent to pathology.   WUXLKG,MWNUUV A MD 9/20/201212:43 PM

## 2011-08-05 NOTE — H&P (Signed)
Gwendolyn Glenn, Gwendolyn Glenn              ACCOUNT NO.:  [id number]  MEDICAL RECORD NO.:  [id number]  LOCATION:                                 FACILITY:  PHYSICIAN:  Dois Davenport A. Rivard, M.D. DATE OF BIRTH:  August 17, 1981  DATE OF ADMISSION: DATE OF DISCHARGE:                             HISTORY & PHYSICAL   The patient is a 30 year old gravida 60, para 2-1-9-3 who presents at 55- 1/7 weeks with twin gestation for scheduled primary cesarean section. The patient was seen in the office yesterday and was noted to have no growth in 2 weeks for baby A with a 10% discrepancy in growth, both babies had normal fluid.  Dr. Estanislado Pandy conferred with Dr. Sherrie George and the recommendation was to proceed with C-section on August 05, 2011.  Pregnancy has been remarkable for: 1. Diamniotic dichorionic twins. 2. Preterm delivery times one. 3. Allergy to PENICILLINS which cause hives. 4. Rh negative with RhoGAM received. 5. Transfer from [facility] at 33 weeks. 6. Discordant growth of twin A at approximately 4th percentile with     baby B being at the 15th percentile.  PRENATAL LABS:  Blood type is A negative, Rh antibody negative, VDRL nonreactive, rubella titer positive, hepatitis B surface antigen negative, HIV was nonreactive.  GC and Chlamydia cultures were negative in February.  Urinalysis was also negative in February.  The patient had an elevated glucose challenge.  She did CBGs for 2 weeks and had a normal 3-hour GTT.  No genetic testing or genetic screening is noted in the chart.  Integrated screen and AFP normal.  HISTORY OF PRESENT PREGNANCY:  The patient entered care at [facility] at 33 weeks.  She had been transferred from [facility] physician's.  She had been at [facility] on July 09, 2011, for preterm labor.  She received betamethasone at that time and was using Procardia p.r.n.  Baby A at 33 weeks was breech with normal fluid and normal Dopplers.  Baby B was  transverse, normal Dopplers, and normal fluid.  Cervix was 1.9 cm at that time.  She had been seen by maternal fetal medicine at 31-5/7th weeks, twin A had Dopplers at the 96th percentile with an abdominal circumference lag with discrepancies of growth of A at 24th percentile and B at the 45th percentile at 27 weeks A was at the 36 percentile and B was at the 53rd percentile.  At 23 weeks there had been a low-lying placenta that was resolved.  She did please note to have an integrated screening that was normal and AFP that was normal at that time.  She had anatomy scan at 17 weeks showing normal growth and development with normal cervical length.  She has had an abnormal Glucola at 175.  She did CBGs for 2 weeks which were normal. Her 3-hour GTT after she came to [place] was normal.  At 33 weeks when she had her first visit with Dr. Estanislado Pandy, she was 150%, vertex, -2 and she was maintained on bedrest.  She then had another ultrasound a week after that at 34 weeks showing a 20% discordance between the two with A being breech at the 15th  percentile, B being vertex at the 46th percentile.  Cervix was 2.96 cm long.  She was scheduled for primary C-section initially on August 12, 2011.  She had a reactive NST at 34 weeks when she was seen on August 04, 2011, She was having some increased fatigue.  Her blood pressure was normal. She was having decreased fetal movement. At that time A was noted to be breech with growth at the 4th percentile.  Estimated fetal weight of 4 pounds 13 ounces with a reactive NST with normal fluid and normal Dopplers.  B was transverse at 5 pounds 6 ounces at the 15th percentile with normal fluid and normal Dopplers NST was also reactive on that baby.  She had 10% discrepancy of growth and there had been no growth in 2 weeks for baby A on head circumference.  Dr. Sherrie George was consulted and she recommended C section be moved from August 12, 2011  to August 05, 2011  OBSTETRICAL HISTORY:  The patient had a history of 6 missed AB all before approximately 5 weeks prior to 2000.  In 2000, she had a vaginal birth of a female infant weight 4 pounds at 31 weeks.  She had premature rupture of membranes and preterm delivery in 2004.  She had a TAB at 3 months in 2006 she had twins a female and a female at 35 weeks with 8 hours in labor.  She had epidural anesthesia.  She was induced for those children in 2008 she had a missed AB at 5 weeks in September 2011, she had a missed AB at 11 weeks and her current pregnancy she is pregnant with twins again.  MEDICAL HISTORY:  She used [place] pills, Depo-Provera in the past.  She had abnormal Pap in 2007 but repeat was okay.  She reports usual childhood illnesses.  Surgical history includes a D and C in 2011, wisdom teeth extracted in 2011.  She is allergic to PENICILLIN and SULFA which causes hives.  She has had no previous use of cephalosporins in the past.  She is also sensitive to sulfa which also causes hives.  She has a slight sensitivity to LATEX.  FAMILY HISTORY:  Maternal aunt is hyperthyroid.  Genetic history is remarkable for the patient's children's father and mother.  She has hemophilia.  SOCIAL HISTORY:  The patient is single.  Father of baby is involved and supportive.  His name is Madaline Brilliant.  The patient is Caucasian of the [place] faith.  She has an associate degree and she is working on her bachelor's.  Her partner has high school education some college.  He is a Emergency planning/management officer.  The patient had been followed by the physician service [facility].  She smoked rarely prior to pregnancy and then reports that she stopped during this pregnancy.  PHYSICAL EXAMINATION:  VITAL SIGNS:  Stable.  The patient is febrile. HEENT: Within normal limits. LUNGS:  Breath sounds are clear. HEART:  Regular rate and rhythm without murmur. BREASTS:  Soft and  nontender. ABDOMEN:  Fundal height is approximately 40 cm.  Estimated fetal weights are between 4 and 5 pounds, contractions are very occasional. PELVIC:  Deferred.  Fetal heart rates have been in the 150s by Doppler in the office. EXTREMITIES:  Deep tendon reflexes are 2+ without clonus.  There is 1+ edema noted.  IMPRESSION: 1. Intrauterine twin pregnancy at 36-1/7 weeks. 2. Discordant growth with twin A at the 4th percentile.  Twin B at  15th percentile and no growth of baby A in 2 weeks in head     circumference. 3. Rh negative. 4. PENICILLIN allergy with hives as a reaction. 5. Sporadic smoker. 6. History of preterm delivery x1 with spontaneous onset of labor. 7. Group B streptococcus status unknown.  PLAN: 1. Admit to [facility] per consult with Dr. Silverio Lay as attending physician. 2. Routine physician preoperative orders.     Renaldo Reel Emilee Hero, C.N.M.   ______________________________ Crist Fat Rivard, M.D.    Leeanne Mannan  D:  08/04/2011  T:  08/05/2011  Job:  [id number]

## 2011-08-05 NOTE — Brief Op Note (Signed)
08/05/2011  12:38 PM  PATIENT:  Gwendolyn Glenn  30 y.o. female  PRE-OPERATIVE DIAGNOSIS:  Twins; Breech/Transverse @ 36+1 weeks with IUGR on twin A  POST-OPERATIVE DIAGNOSIS:  same  PROCEDURE:  Procedure(s): CESAREAN SECTION  SURGEON:  Surgeon(s): Esmeralda Arthur, MD MD  ASSISTANTS: [name] CNM   ANESTHESIA:   spinal  LOCAL MEDICATIONS USED:  MARCAINE 20CC  SPECIMEN:  Placenta X 2  DISPOSITION OF SPECIMEN:  PATHOLOGY  COUNTS:  YES  ESTIMATED BLOOD LOSS: 700 cc  PATIENT DISPOSITION:  PACU - hemodynamically stable.       ZOXWRU,EAVWUJ A MD 08/05/2011 12:38 PM

## 2011-08-06 ENCOUNTER — Inpatient Hospital Stay (HOSPITAL_COMMUNITY): Admission: RE | Admit: 2011-08-06 | Discharge: 2011-08-06 | Payer: Medicaid Other | Source: Ambulatory Visit

## 2011-08-06 LAB — CBC
HCT: 29.5 % — ABNORMAL LOW (ref 36.0–46.0)
Platelets: 238 10*3/uL (ref 150–400)
RDW: 14.1 % (ref 11.5–15.5)
WBC: 11.5 10*3/uL — ABNORMAL HIGH (ref 4.0–10.5)

## 2011-08-06 LAB — TYPE AND SCREEN
ABO/RH(D): A NEG
Antibody Screen: POSITIVE

## 2011-08-06 MED ORDER — RHO D IMMUNE GLOBULIN 1500 UNIT/2ML IJ SOLN
300.0000 ug | Freq: Once | INTRAMUSCULAR | Status: AC
  Administered 2011-08-06: 300 ug via INTRAMUSCULAR
  Filled 2011-08-06: qty 2

## 2011-08-06 NOTE — Progress Notes (Signed)
UR chart review completed.  

## 2011-08-06 NOTE — Anesthesia Postprocedure Evaluation (Signed)
  Anesthesia Post-op Note  Patient: Gwendolyn Glenn  Procedure(s) Performed:  CESAREAN SECTION - Primary Cesarean Section with Delivery  Twins  Baby A Boy @ [phone], Baby B Girl @ [phone]  Patient Location: PACU and Mother/Baby  Anesthesia Type: Spinal  Level of Consciousness: awake, alert  and oriented  Airway and Oxygen Therapy: Patient Spontanous Breathing  Post-op Pain: mild  Post-op Assessment: Patient's Cardiovascular Status Stable and Respiratory Function Stable  Post-op Vital Signs: stable  Complications: No apparent anesthesia complications

## 2011-08-06 NOTE — Progress Notes (Signed)
Subjective: Postpartum Day 1: Cesarean Delivery Patient reports no problems voiding.    Objective: Vital signs in last 24 hours: Temp:  [97.6 F (36.4 C)-98.7 F (37.1 C)] 98.3 F (36.8 C) (09/21 0500) Pulse Rate:  [54-75] 75  (09/21 0500) Resp:  [13-23] 18  (09/21 0500) BP: (92-120)/(51-78) 120/78 mmHg (09/21 0500) SpO2:  [97 %-100 %] 97 % (09/21 0500) Weight:  [82.101 kg (181 lb)] 181 lb (82.101 kg) (09/20 1630)  Physical Exam:  General: alert and no distress Lochia: appropriate Uterine Fundus: firm Incision: Dressing is clean DVT Evaluation: No evidence of DVT seen on physical exam.   Basename 08/06/11 0550 08/05/11 0949  HGB 9.7* 11.3*  HCT 29.5* 33.9*    Assessment/Plan: Status post Cesarean section. Doing well postoperatively.  Continue current care.  Garlen Reinig V 08/06/2011, 11:30 AM

## 2011-08-07 LAB — RH IG WORKUP (INCLUDES ABO/RH)
ABO/RH(D): A NEG
Fetal Screen: NEGATIVE
Gestational Age(Wks): 36

## 2011-08-07 MED ORDER — FERROUS SULFATE 325 (65 FE) MG PO TABS: 325.0000 mg | ORAL_TABLET | Freq: Every day | ORAL | Status: DC

## 2011-08-07 MED ORDER — NORETHINDRONE 0.35 MG PO TABS: 1.0000 | ORAL_TABLET | Freq: Every day | ORAL | Status: DC

## 2011-08-07 MED ORDER — OXYCODONE-ACETAMINOPHEN 5-325 MG PO TABS: 1.0000 | ORAL_TABLET | ORAL | Status: AC | PRN

## 2011-08-07 MED ORDER — INFLUENZA VIRUS VACC SPLIT PF IM SUSP
0.5000 mL | Freq: Once | INTRAMUSCULAR | Status: AC
  Administered 2011-08-07: 0.5 mL via INTRAMUSCULAR
  Filled 2011-08-07: qty 0.5

## 2011-08-07 MED ORDER — IBUPROFEN 600 MG PO TABS: 600.0000 mg | ORAL_TABLET | Freq: Four times a day (QID) | ORAL | Status: AC

## 2011-08-07 NOTE — Progress Notes (Addendum)
Subjective:  Postpartum Day 2: Cesarean Delivery Patient reports tolerating PO, + flatus and no problems voiding.  Denies weakness or dizziness.  Reports ambulating without difficulty.  Reports pain is controlled with medications.  Breastfeeding without difficulties.  Thinks she would like to go home today if babies can go.    Objective: Vital signs in last 24 hours: Temp:  [97.5 F (36.4 C)-98.4 F (36.9 C)] 98.4 F (36.9 C) (09/22 0621) Pulse Rate:  [61-71] 71  (09/22 0621) Resp:  [18-19] 18  (09/22 0621) BP: (94-99)/(61-65) 94/61 mmHg (09/22 1610)  Physical Exam:  General: alert, cooperative and no distress Skin warm & dry.  Color satisfactory Heart:  Reg rate/rhythm. Lungs:  Clear to A/P ausc bilat Abdoment:  Soft, nontender with positive bowel sounds x 4 quads. Lochia: appropriate Uterine Fundus: firm, nontender 1 below umbilicus. Incision: Dressing remains present.  No circumferential redness noted. DVT Evaluation: No evidence of DVT seen on physical exam.  Negative Homan's sign bilaterally. No significant calf/ankle edema. DTR's 1+, no clonus   Basename 08/06/11 0550 08/05/11 0949  HGB 9.7* 11.3*  HCT 29.5* 33.9*    Assessment/Plan: Status post Cesarean section. Doing well postoperatively.  Asymptomatic anemia  Continue current care.  Can discharge to home today if babies can go.   Anuj Summons O. 08/07/2011, 10:31 AM   08/07/11 @  1:40 PM Called to see patient for discharge as her infants have been discharged by peds.  Pt states she desires to be discharged home today.   Since previous note, she has been up to shower and dressing has now been removed. Incision clean/dry/intact with steristrips in place.  No redness noted. Physical exam otherwise unchanged.  Desires Micronor for contraception.  Discharge instructions reviewed.  Will discharge to home.  RTO in 6wks for follow up.  To begin Micronor on Sunday 08/22/11.

## 2011-08-07 NOTE — Discharge Summary (Signed)
Obstetric Discharge Summary Reason for Admission: cesarean section and twin pregnancy and intrauterine growth restriction. Prenatal Procedures: NST and ultrasound Intrapartum Procedures: cesarean: low cervical, transverse Postpartum Procedures: none Complications-Operative and Postpartum: none Hemoglobin  Date Value Range Status  08/06/2011 9.7* 12.0-15.0 (g/dL) Final     HCT  Date Value Range Status  08/06/2011 29.5* 36.0-46.0 (%) Final    Discharge Diagnoses: Twin pregnancy at 36 weeks, delivered        Intrauterine growth restriction                                         Breech presentation-Twin A  Discharge Information: Date: 08/07/2011 Activity: unrestricted Diet: routine Medications: PNV, Ibuprophen, Iron and Percocet Condition: stable Instructions: refer to practice specific booklet Discharge to: home Follow-up Information    Follow up with [facility].         Newborn Data:   Ashtin, Melichar [[id number]]  Live born female  Birth Weight: 4 lb 14.8 oz (2234 g) APGAR: 7, 9   Skyy, Nilan [[id number]]  Live born female  Birth Weight: 5 lb 0.1 oz (2270 g) APGAR: 9, 9  Home with mother.  Makaiah Terwilliger O. 08/07/2011, 1:16 PM

## 2011-08-12 ENCOUNTER — Encounter (HOSPITAL_COMMUNITY): Payer: Self-pay | Admitting: Obstetrics and Gynecology

## 2012-07-12 ENCOUNTER — Encounter: Payer: Self-pay | Admitting: Obstetrics and Gynecology

## 2012-10-04 ENCOUNTER — Other Ambulatory Visit: Payer: Self-pay

## 2012-10-04 ENCOUNTER — Telehealth: Payer: Self-pay | Admitting: Obstetrics and Gynecology

## 2012-10-04 ENCOUNTER — Ambulatory Visit: Payer: Medicaid Other

## 2012-10-04 ENCOUNTER — Other Ambulatory Visit: Payer: Self-pay | Admitting: Obstetrics and Gynecology

## 2012-10-04 DIAGNOSIS — Z331 Pregnant state, incidental: Secondary | ICD-10-CM

## 2012-10-04 DIAGNOSIS — O26849 Uterine size-date discrepancy, unspecified trimester: Secondary | ICD-10-CM

## 2012-10-04 NOTE — Progress Notes (Signed)
Pt presented for NOB interview. States is feeling very anxious about this pregnancy. Had +UPT 06/2012.  Had heavy menses 07/2012, followed by weak + UPT.   Had watery pink D/C 08/22/12 x a few days.  +UPT 09/18/12.  Is having lower rt-sided abd pain.  States is Rh neg. Per DR VPH, [facility] and Antibody screen ordered stat.  Pt is unable to return later today for U/S due  to child care. Scheduled for U/S and F/U with DR SR 10/05/12..  Pt advised to call with any bleeding or increased pain. Pt verbalizes comprehension.

## 2012-10-04 NOTE — Telephone Encounter (Signed)
TC to pt.  Per DR VPH informed Gwendolyn Glenn is [id number]. Antibody screen pending.  Informed results are very good.  To keep appt 10/05/12. Call prior with any concerns.

## 2012-10-05 ENCOUNTER — Ambulatory Visit (INDEPENDENT_AMBULATORY_CARE_PROVIDER_SITE_OTHER): Payer: Medicaid Other

## 2012-10-05 ENCOUNTER — Other Ambulatory Visit: Payer: Self-pay | Admitting: Obstetrics and Gynecology

## 2012-10-05 ENCOUNTER — Encounter: Payer: Self-pay | Admitting: Obstetrics and Gynecology

## 2012-10-05 ENCOUNTER — Ambulatory Visit (INDEPENDENT_AMBULATORY_CARE_PROVIDER_SITE_OTHER): Payer: Medicaid Other | Admitting: Obstetrics and Gynecology

## 2012-10-05 VITALS — BP 96/52 | Ht 69.0 in | Wt 152.0 lb

## 2012-10-05 DIAGNOSIS — O26849 Uterine size-date discrepancy, unspecified trimester: Secondary | ICD-10-CM

## 2012-10-05 DIAGNOSIS — Z8759 Personal history of other complications of pregnancy, childbirth and the puerperium: Secondary | ICD-10-CM

## 2012-10-05 DIAGNOSIS — Z8742 Personal history of other diseases of the female genital tract: Secondary | ICD-10-CM

## 2012-10-05 DIAGNOSIS — O3680X0 Pregnancy with inconclusive fetal viability, not applicable or unspecified: Secondary | ICD-10-CM

## 2012-10-05 LAB — ANTIBODY SCREEN: Antibody Screen: NEGATIVE

## 2012-10-05 MED ORDER — ONDANSETRON HCL 8 MG PO TABS: 8.0000 mg | ORAL_TABLET | Freq: Three times a day (TID) | ORAL | Status: DC | PRN

## 2012-10-05 NOTE — Progress Notes (Signed)
Subjective:    Gwendolyn Glenn is a 31 y.o. female, [id number], who presents for Gyn ultrasound because of Pregnancy Dating.  The following portions of the patient's history were reviewed and updated as appropriate: allergies, current medications, past family history.  Objective:    BP 96/52  Ht 5\' 9"  (1.753 m)  Wt 152 lb (68.947 kg)  BMI 22.45 kg/m2  LMP 08/22/2012    Weight:  Wt Readings from Last 1 Encounters:  10/05/12 152 lb (68.947 kg)          BMI: Body mass index is 22.45 kg/(m^2).  ULTRASOUND: Uterus Anteverted    Adnexa Normal    Endometrium n/a    Free fluid: no    Other findings:  Urinary bladder - unremarkable. Singleton pregnancy. Fetal pole and yolk sac seen. + FHTs.  CRL = GA of [redacted]w[redacted]d with an EDD of 05/31/13. Embryonic heart rate is WNLs for a CRL < 5 mm. Cx closed. 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

## 2012-10-09 ENCOUNTER — Telehealth: Payer: Self-pay

## 2012-10-09 LAB — US OB COMP LESS 14 WKS

## 2012-10-09 LAB — US OB TRANSVAGINAL

## 2012-10-09 MED ORDER — NONFORMULARY OR COMPOUNDED ITEM: 25.0000 mg | Freq: Two times a day (BID) | Status: DC

## 2012-10-09 NOTE — Telephone Encounter (Signed)
Message copied by Darien Ramus on Mon Oct 09, 2012 10:30 AM ------      Message from: Silverio Lay      Created: Mon Oct 09, 2012 10:18 AM       Please call pt and start Progesterone vaginal suppository 25 mg BID   #14 / 4R

## 2012-10-09 NOTE — Telephone Encounter (Signed)
Pt advised and Rx sent to [facility] [facility].  Darien Ramus

## 2012-10-10 ENCOUNTER — Telehealth: Payer: Self-pay

## 2012-10-10 NOTE — Telephone Encounter (Signed)
VM from pt to advise that medicaid will not cover Progesterone suppository. Wanting to know if there is something different that she can have in place of this that ins will cover. Called pharmacy and they stated that ins will only pay for oral progesterone. Is it ok to switch to oral? Pt states there is no way that she will be able to pay out of pocket for suppository.  Darien Ramus

## 2012-10-11 MED ORDER — PROGESTERONE MICRONIZED 200 MG PO CAPS: 200.0000 mg | ORAL_CAPSULE | Freq: Every day | ORAL | Status: DC

## 2012-10-11 NOTE — Telephone Encounter (Signed)
Per SR Prometrium 200 mg daily.  Pt advised and voiced understading  Gwendolyn Glenn, Herbert Seta

## 2012-10-16 ENCOUNTER — Telehealth: Payer: Self-pay

## 2012-10-16 NOTE — Telephone Encounter (Signed)
Ok to call in Phenergan 25 mg every 8 hours as needed  #30 / 1 RF

## 2012-10-16 NOTE — Telephone Encounter (Signed)
Returned pt call per VM. Pt wanting to know why progesterone was only sent to pharmacy for 1 week at a time. Advised pt that we did that because we are unsure of how long she may need to take them. Voiced understanding. Pt also states that zofran is not helping with her nausea. Wanting to know if something different can be called to pharmacy? Gwendolyn Glenn

## 2012-10-17 MED ORDER — PROMETHAZINE HCL 50 MG PO TABS: 25.0000 mg | ORAL_TABLET | Freq: Three times a day (TID) | ORAL | Status: DC | PRN

## 2012-10-17 NOTE — Telephone Encounter (Signed)
Pt advised of rx sent to pharmacy Gwendolyn Glenn

## 2012-10-23 ENCOUNTER — Encounter: Payer: Self-pay | Admitting: Obstetrics and Gynecology

## 2012-10-23 ENCOUNTER — Ambulatory Visit (INDEPENDENT_AMBULATORY_CARE_PROVIDER_SITE_OTHER): Payer: Medicaid Other

## 2012-10-23 ENCOUNTER — Ambulatory Visit (INDEPENDENT_AMBULATORY_CARE_PROVIDER_SITE_OTHER): Payer: Medicaid Other | Admitting: Obstetrics and Gynecology

## 2012-10-23 VITALS — BP 94/62 | Wt 162.0 lb

## 2012-10-23 DIAGNOSIS — Z8742 Personal history of other diseases of the female genital tract: Secondary | ICD-10-CM

## 2012-10-23 DIAGNOSIS — Z331 Pregnant state, incidental: Secondary | ICD-10-CM

## 2012-10-23 DIAGNOSIS — O2620 Pregnancy care for patient with recurrent pregnancy loss, unspecified trimester: Secondary | ICD-10-CM

## 2012-10-23 DIAGNOSIS — Z8759 Personal history of other complications of pregnancy, childbirth and the puerperium: Secondary | ICD-10-CM

## 2012-10-23 MED ORDER — CENTRUM SPECIALIST PRENATAL 27-0.8 & 200 MG PO MISC: 1.0000 | Freq: Every morning | ORAL | Status: DC

## 2012-10-23 NOTE — Progress Notes (Signed)
[redacted]w[redacted]d Pt had U/s done today--HX SOB Pt complains of being Nauseated today.

## 2012-10-23 NOTE — Patient Instructions (Signed)
ABCs of Pregnancy  A  Antepartum care is very important. Be sure you see your doctor and get prenatal care as soon as you think you are pregnant. At this time, you will be tested for infection, genetic abnormalities and potential problems with you and the pregnancy. This is the time to discuss diet, exercise, work, medications, labor, pain medication during labor and the possibility of a cesarean delivery. Ask any questions that may concern you. It is important to see your doctor regularly throughout your pregnancy. Avoid exposure to toxic substances and chemicals - such as cleaning solvents, lead and mercury, some insecticides, and paint. Pregnant women should avoid exposure to paint fumes, and fumes that cause you to feel ill, dizzy or faint. When possible, it is a good idea to have a pre-pregnancy consultation with your caregiver to begin some important recommendations your caregiver suggests such as, taking folic acid, exercising, quitting smoking, avoiding alcoholic beverages, etc.  B  Breastfeeding is the healthiest choice for both you and your baby. It has many nutritional benefits for the baby and health benefits for the mother. It also creates a very tight and loving bond between the baby and mother. Talk to your doctor, your family and friends, and your employer about how you choose to feed your baby and how they can support you in your decision. Not all birth defects can be prevented, but a woman can take actions that may increase her chance of having a healthy baby. Many birth defects happen very early in pregnancy, sometimes before a woman even knows she is pregnant. Birth defects or abnormalities of any child in your or the father's family should be discussed with your caregiver. Get a good support bra as your breast size changes. Wear it especially when you exercise and when nursing.   C  Celebrate the news of your pregnancy with the your spouse/father and family. Childbirth classes are helpful to  take for you and the spouse/father because it helps to understand what happens during the pregnancy, labor and delivery. Cesarean delivery should be discussed with your doctor so you are prepared for that possibility. The pros and cons of circumcision if it is a boy, should be discussed with your pediatrician. Cigarette smoking during pregnancy can result in low birth weight babies. It has been associated with infertility, miscarriages, tubal pregnancies, infant death (mortality) and poor health (morbidity) in childhood. Additionally, cigarette smoking may cause long-term learning disabilities. If you smoke, you should try to quit before getting pregnant and not smoke during the pregnancy. Secondary smoke may also harm a mother and her developing baby. It is a good idea to ask people to stop smoking around you during your pregnancy and after the baby is born. Extra calcium is necessary when you are pregnant and is found in your prenatal vitamin, in dairy products, green leafy vegetables and in calcium supplements.  D  A healthy diet according to your current weight and height, along with vitamins and mineral supplements should be discussed with your caregiver. Domestic abuse or violence should be made known to your doctor right away to get the situation corrected. Drink more water when you exercise to keep hydrated. Discomfort of your back and legs usually develops and progresses from the middle of the second trimester through to delivery of the baby. This is because of the enlarging baby and uterus, which may also affect your balance. Do not take illegal drugs. Illegal drugs can seriously harm the baby and you. Drink extra   fluids (water is best) throughout pregnancy to help your body keep up with the increases in your blood volume. Drink at least 6 to 8 glasses of water, fruit juice, or milk each day. A good way to know you are drinking enough fluid is when your urine looks almost like clear water or is very light  yellow.   E  Eat healthy to get the nutrients you and your unborn baby need. Your meals should include the five basic food groups. Exercise (30 minutes of light to moderate exercise a day) is important and encouraged during pregnancy, if there are no medical problems or problems with the pregnancy. Exercise that causes discomfort or dizziness should be stopped and reported to your caregiver. Emotions during pregnancy can change from being ecstatic to depression and should be understood by you, your partner and your family.  F  Fetal screening with ultrasound, amniocentesis and monitoring during pregnancy and labor is common and sometimes necessary. Take 400 micrograms of folic acid daily both before, when possible, and during the first few months of pregnancy to reduce the risk of birth defects of the brain and spine. All women who could possibly become pregnant should take a vitamin with folic acid, every day. It is also important to eat a healthy diet with fortified foods (enriched grain products, including cereals, rice, breads, and pastas) and foods with natural sources of folate (orange juice, green leafy vegetables, beans, peanuts, broccoli, asparagus, peas, and lentils). The father should be involved with all aspects of the pregnancy including, the prenatal care, childbirth classes, labor, delivery, and postpartum time. Fathers may also have emotional concerns about being a father, financial needs, and raising a family.  G  Genetic testing should be done appropriately. It is important to know your family and the father's history. If there have been problems with pregnancies or birth defects in your family, report these to your doctor. Also, genetic counselors can talk with you about the information you might need in making decisions about having a family. You can call a major medical center in your area for help in finding a board-certified genetic counselor. Genetic testing and counseling should be done  before pregnancy when possible, especially if there is a history of problems in the mother's or father's family. Certain ethnic backgrounds are more at risk for genetic defects.  H  Get familiar with the hospital where you will be having your baby. Get to know how long it takes to get there, the labor and delivery area, and the hospital procedures. Be sure your medical insurance is accepted there. Get your home ready for the baby including, clothes, the baby's room (when possible), furniture and car seat. Hand washing is important throughout the day, especially after handling raw meat and poultry, changing the baby's diaper or using the bathroom. This can help prevent the spread of many bacteria and viruses that cause infection. Your hair may become dry and thinner, but will return to normal a few weeks after the baby is born. Heartburn is a common problem that can be treated by taking antacids recommended by your caregiver, eating smaller meals 5 or 6 times a day, not drinking liquids when eating, drinking between meals and raising the head of your bed 2 to 3 inches.  I  Insurance to cover you, the baby, doctor and hospital should be reviewed so that you will be prepared to pay any costs not covered by your insurance plan. If you do not have medical insurance,   there are usually clinics and services available for you in your community. Take 30 milligrams of iron during your pregnancy as prescribed by your doctor to reduce the risk of low red blood cells (anemia) later in pregnancy. All women of childbearing age should eat a diet rich in iron.  J  There should be a joint effort for the mother, father and any other children to adapt to the pregnancy financially, emotionally, and psychologically during the pregnancy. Join a support group for moms-to-be. Or, join a class on parenting or childbirth. Have the family participate when possible.  K  Know your limits. Let your caregiver know if you experience any of the  following:   · Pain of any kind.  · Strong cramps.  · You develop a lot of weight in a short period of time (5 pounds in 3 to 5 days).  · Vaginal bleeding, leaking of amniotic fluid.  · Headache, vision problems.  · Dizziness, fainting, shortness of breath.  · Chest pain.  · Fever of 102° F (38.9° C) or higher.  · Gush of clear fluid from your vagina.  · Painful urination.  · Domestic violence.  · Irregular heartbeat (palpitations).  · Rapid beating of the heart (tachycardia).  · Constant feeling sick to your stomach (nauseous) and vomiting.  · Trouble walking, fluid retention (edema).  · Muscle weakness.  · If your baby has decreased activity.  · Persistent diarrhea.  · Abnormal vaginal discharge.  · Uterine contractions at 20-minute intervals.  · Back pain that travels down your leg.  L  Learn and practice that what you eat and drink should be in moderation and healthy for you and your baby. Legal drugs such as alcohol and caffeine are important issues for pregnant women. There is no safe amount of alcohol a woman can drink while pregnant. Fetal alcohol syndrome, a disorder characterized by growth retardation, facial abnormalities, and central nervous system dysfunction, is caused by a woman's use of alcohol during pregnancy. Caffeine, found in tea, coffee, soft drinks and chocolate, should also be limited. Be sure to read labels when trying to cut down on caffeine during pregnancy. More than 200 foods, beverages, and over-the-counter medications contain caffeine and have a high salt content! There are coffees and teas that do not contain caffeine.  M  Medical conditions such as diabetes, epilepsy, and high blood pressure should be treated and kept under control before pregnancy when possible, but especially during pregnancy. Ask your caregiver about any medications that may need to be changed or adjusted during pregnancy. If you are currently taking any medications, ask your caregiver if it is safe to take them  while you are pregnant or before getting pregnant when possible. Also, be sure to discuss any herbs or vitamins you are taking. They are medicines, too! Discuss with your doctor all medications, prescribed and over-the-counter, that you are taking. During your prenatal visit, discuss the medications your doctor may give you during labor and delivery.  N  Never be afraid to ask your doctor or caregiver questions about your health, the progress of the pregnancy, family problems, stressful situations, and recommendation for a pediatrician, if you do not have one. It is better to take all precautions and discuss any questions or concerns you may have during your office visits. It is a good idea to write down your questions before you visit the doctor.  O  Over-the-counter cough and cold remedies may contain alcohol or other ingredients that should   be avoided during pregnancy. Ask your caregiver about prescription, herbs or over-the-counter medications that you are taking or may consider taking while pregnant.   P  Physical activity during pregnancy can benefit both you and your baby by lessening discomfort and fatigue, providing a sense of well-being, and increasing the likelihood of early recovery after delivery. Light to moderate exercise during pregnancy strengthens the belly (abdominal) and back muscles. This helps improve posture. Practicing yoga, walking, swimming, and cycling on a stationary bicycle are usually safe exercises for pregnant women. Avoid scuba diving, exercise at high altitudes (over 3000 feet), skiing, horseback riding, contact sports, etc. Always check with your doctor before beginning any kind of exercise, especially during pregnancy and especially if you did not exercise before getting pregnant.  Q  Queasiness, stomach upset and morning sickness are common during pregnancy. Eating a couple of crackers or dry toast before getting out of bed. Foods that you normally love may make you feel sick to  your stomach. You may need to substitute other nutritious foods. Eating 5 or 6 small meals a day instead of 3 large ones may make you feel better. Do not drink with your meals, drink between meals. Questions that you have should be written down and asked during your prenatal visits.  R  Read about and make plans to baby-proof your home. There are important tips for making your home a safer environment for your baby. Review the tips and make your home safer for you and your baby. Read food labels regarding calories, salt and fat content in the food.  S  Saunas, hot tubs, and steam rooms should be avoided while you are pregnant. Excessive high heat may be harmful during your pregnancy. Your caregiver will screen and examine you for sexually transmitted diseases and genetic disorders during your prenatal visits. Learn the signs of labor. Sexual relations while pregnant is safe unless there is a medical or pregnancy problem and your caregiver advises against it.  T  Traveling long distances should be avoided especially in the third trimester of your pregnancy. If you do have to travel out of state, be sure to take a copy of your medical records and medical insurance plan with you. You should not travel long distances without seeing your doctor first. Most airlines will not allow you to travel after 36 weeks of pregnancy. Toxoplasmosis is an infection caused by a parasite that can seriously harm an unborn baby. Avoid eating undercooked meat and handling cat litter. Be sure to wear gloves when gardening. Tingling of the hands and fingers is not unusual and is due to fluid retention. This will go away after the baby is born.  U  Womb (uterus) size increases during the first trimester. Your kidneys will begin to function more efficiently. This may cause you to feel the need to urinate more often. You may also leak urine when sneezing, coughing or laughing. This is due to the growing uterus pressing against your bladder,  which lies directly in front of and slightly under the uterus during the first few months of pregnancy. If you experience burning along with frequency of urination or bloody urine, be sure to tell your doctor. The size of your uterus in the third trimester may cause a problem with your balance. It is advisable to maintain good posture and avoid wearing high heels during this time. An ultrasound of your baby may be necessary during your pregnancy and is safe for you and your baby.  V    Vaccinations are an important concern for pregnant women. Get needed vaccines before pregnancy. Center for Disease Control (www.cdc.gov) has clear guidelines for the use of vaccines during pregnancy. Review the list, be sure to discuss it with your doctor. Prenatal vitamins are helpful and healthy for you and the baby. Do not take extra vitamins except what is recommended. Taking too much of certain vitamins can cause overdose problems. Continuous vomiting should be reported to your caregiver. Varicose veins may appear especially if there is a family history of varicose veins. They should subside after the delivery of the baby. Support hose helps if there is leg discomfort.  W  Being overweight or underweight during pregnancy may cause problems. Try to get within 15 pounds of your ideal weight before pregnancy. Remember, pregnancy is not a time to be dieting! Do not stop eating or start skipping meals as your weight increases. Both you and your baby need the calories and nutrition you receive from a healthy diet. Be sure to consult with your doctor about your diet. There is a formula and diet plan available depending on whether you are overweight or underweight. Your caregiver or nutritionist can help and advise you if necessary.  X  Avoid X-rays. If you must have dental work or diagnostic tests, tell your dentist or physician that you are pregnant so that extra care can be taken. X-rays should only be taken when the risks of not taking  them outweigh the risk of taking them. If needed, only the minimum amount of radiation should be used. When X-rays are necessary, protective lead shields should be used to cover areas of the body that are not being X-rayed.  Y  Your baby loves you. Breastfeeding your baby creates a loving and very close bond between the two of you. Give your baby a healthy environment to live in while you are pregnant. Infants and children require constant care and guidance. Their health and safety should be carefully watched at all times. After the baby is born, rest or take a nap when the baby is sleeping.  Z  Get your ZZZs. Be sure to get plenty of rest. Resting on your side as often as possible, especially on your left side is advised. It provides the best circulation to your baby and helps reduce swelling. Try taking a nap for 30 to 45 minutes in the afternoon when possible. After the baby is born rest or take a nap when the baby is sleeping. Try elevating your feet for that amount of time when possible. It helps the circulation in your legs and helps reduce swelling.   Most information courtesy of the CDC.  Document Released: 11/01/2005 Document Revised: 01/24/2012 Document Reviewed: 07/16/2009  [employer]® Patient Information ©2013 [employer].

## 2012-10-23 NOTE — Progress Notes (Signed)
[redacted]w[redacted]d [place] s=d siup Amnion and chorion seen Pt still with n/v.  Gets some reliev with antiemetics Rt 2-4 weeks for NEW OB visit

## 2012-11-01 ENCOUNTER — Telehealth: Payer: Self-pay | Admitting: Obstetrics and Gynecology

## 2012-11-01 NOTE — Telephone Encounter (Signed)
Tc to pt regarding msg.  Was unable to lm b/c vm has not been set up yet.  Will await pt call or attempt call back.

## 2012-11-07 NOTE — Telephone Encounter (Signed)
Tc to pt regarding msg.  Was unable to lm b/c vm has not been set up yet.  Will await pt call or attempt call back. 

## 2012-11-15 NOTE — L&D Delivery Note (Signed)
Delivery Note At 11:34 PM a viable female, "Gwendolyn Glenn", was delivered via  (Presentation: ROA ;  ).  APGAR: 9/9, ; weight .   Placenta status: spontaneous, intact, .  Cord:  with the following complications: None.  Cord pH: None  Anesthesia: Epidural  Episiotomy: None Lacerations: None requiring repair--bilateral periurethral "skid marks", hemostatic. Suture Repair: None Est. Blood Loss (mL): 300 cc  Mom to postpartum.  Baby to skin to skin. Family plans outpatient circumcision Rhophylac prn. Offer MMR vaccine pp due to rubella non-immune status.   [name] 06/04/2013, 11:57 PM

## 2012-11-16 ENCOUNTER — Other Ambulatory Visit: Payer: Self-pay | Admitting: Obstetrics and Gynecology

## 2012-11-16 ENCOUNTER — Telehealth: Payer: Self-pay | Admitting: Obstetrics and Gynecology

## 2012-11-16 DIAGNOSIS — Z36 Encounter for antenatal screening of mother: Secondary | ICD-10-CM

## 2012-11-16 NOTE — Telephone Encounter (Signed)
T.c. From patient with many concerns of the office and office procedures in relation to pregnancy.  Concerns were addressed.  Patient is scheduled for NOB interview 11/24/12 and inquired about 1st trimester screen.  Patient currently [redacted]w[redacted]d.  I consulted with Tessie Fass, RN, who stated per protocol, pt may be scheduled for u/s.  Patient scheduled for 1st trimester screen 11/22/12.  Patient agrees.

## 2012-11-22 ENCOUNTER — Other Ambulatory Visit: Payer: Self-pay | Admitting: Obstetrics and Gynecology

## 2012-11-22 ENCOUNTER — Ambulatory Visit (INDEPENDENT_AMBULATORY_CARE_PROVIDER_SITE_OTHER): Payer: Medicaid Other

## 2012-11-22 DIAGNOSIS — Z36 Encounter for antenatal screening of mother: Secondary | ICD-10-CM

## 2012-11-24 ENCOUNTER — Ambulatory Visit: Payer: Medicaid Other | Admitting: Obstetrics and Gynecology

## 2012-11-24 DIAGNOSIS — Z331 Pregnant state, incidental: Secondary | ICD-10-CM

## 2012-11-24 NOTE — Progress Notes (Signed)
NOB complete PNL to be drawn today Urine culture today

## 2012-11-26 LAB — CULTURE, OB URINE: Colony Count: NO GROWTH

## 2012-11-27 LAB — PRENATAL PANEL VII
Eosinophils Absolute: 0.1 10*3/uL (ref 0.0–0.7)
Eosinophils Relative: 2 % (ref 0–5)
Hemoglobin: 12.9 g/dL (ref 12.0–15.0)
Hepatitis B Surface Ag: NEGATIVE
Lymphocytes Relative: 18 % (ref 12–46)
Lymphs Abs: 1.6 10*3/uL (ref 0.7–4.0)
MCH: 29.9 pg (ref 26.0–34.0)
MCV: 88.4 fL (ref 78.0–100.0)
Monocytes Relative: 6 % (ref 3–12)
Neutrophils Relative %: 74 % (ref 43–77)
RBC: 4.32 MIL/uL (ref 3.87–5.11)
Rubella: 0.62 Index (ref <0.90)
WBC: 8.9 10*3/uL (ref 4.0–10.5)

## 2012-11-29 ENCOUNTER — Encounter: Payer: Self-pay | Admitting: Obstetrics and Gynecology

## 2012-11-29 DIAGNOSIS — Z6791 Unspecified blood type, Rh negative: Secondary | ICD-10-CM | POA: Insufficient documentation

## 2012-11-29 DIAGNOSIS — O26899 Other specified pregnancy related conditions, unspecified trimester: Secondary | ICD-10-CM | POA: Insufficient documentation

## 2012-12-12 ENCOUNTER — Ambulatory Visit: Payer: Medicaid Other | Admitting: Obstetrics and Gynecology

## 2012-12-12 VITALS — BP 100/58 | Wt 164.0 lb

## 2012-12-12 DIAGNOSIS — Z91040 Latex allergy status: Secondary | ICD-10-CM

## 2012-12-12 DIAGNOSIS — N12 Tubulo-interstitial nephritis, not specified as acute or chronic: Secondary | ICD-10-CM

## 2012-12-12 DIAGNOSIS — O34219 Maternal care for unspecified type scar from previous cesarean delivery: Secondary | ICD-10-CM

## 2012-12-12 DIAGNOSIS — O2620 Pregnancy care for patient with recurrent pregnancy loss, unspecified trimester: Secondary | ICD-10-CM

## 2012-12-12 DIAGNOSIS — Z882 Allergy status to sulfonamides status: Secondary | ICD-10-CM

## 2012-12-12 DIAGNOSIS — O09899 Supervision of other high risk pregnancies, unspecified trimester: Secondary | ICD-10-CM

## 2012-12-12 DIAGNOSIS — O0930 Supervision of pregnancy with insufficient antenatal care, unspecified trimester: Secondary | ICD-10-CM

## 2012-12-12 DIAGNOSIS — O365990 Maternal care for other known or suspected poor fetal growth, unspecified trimester, not applicable or unspecified: Secondary | ICD-10-CM

## 2012-12-12 DIAGNOSIS — O3090 Multiple gestation, unspecified, unspecified trimester: Secondary | ICD-10-CM

## 2012-12-12 DIAGNOSIS — Z880 Allergy status to penicillin: Secondary | ICD-10-CM

## 2012-12-12 DIAGNOSIS — Z832 Family history of diseases of the blood and blood-forming organs and certain disorders involving the immune mechanism: Secondary | ICD-10-CM

## 2012-12-12 DIAGNOSIS — Z641 Problems related to multiparity: Secondary | ICD-10-CM

## 2012-12-12 NOTE — Progress Notes (Signed)
Pt is here today for her NOB work-up. Pt declines a pelvic exam today due to history.last pap 02/2011.

## 2012-12-13 ENCOUNTER — Encounter: Payer: Self-pay | Admitting: Obstetrics and Gynecology

## 2012-12-13 DIAGNOSIS — O2620 Pregnancy care for patient with recurrent pregnancy loss, unspecified trimester: Secondary | ICD-10-CM | POA: Insufficient documentation

## 2012-12-13 DIAGNOSIS — N12 Tubulo-interstitial nephritis, not specified as acute or chronic: Secondary | ICD-10-CM | POA: Insufficient documentation

## 2012-12-13 DIAGNOSIS — O34219 Maternal care for unspecified type scar from previous cesarean delivery: Secondary | ICD-10-CM | POA: Insufficient documentation

## 2012-12-13 DIAGNOSIS — Z880 Allergy status to penicillin: Secondary | ICD-10-CM | POA: Insufficient documentation

## 2012-12-13 DIAGNOSIS — O0930 Supervision of pregnancy with insufficient antenatal care, unspecified trimester: Secondary | ICD-10-CM | POA: Insufficient documentation

## 2012-12-13 DIAGNOSIS — O09899 Supervision of other high risk pregnancies, unspecified trimester: Secondary | ICD-10-CM | POA: Insufficient documentation

## 2012-12-13 DIAGNOSIS — O3090 Multiple gestation, unspecified, unspecified trimester: Secondary | ICD-10-CM | POA: Insufficient documentation

## 2012-12-13 DIAGNOSIS — Z832 Family history of diseases of the blood and blood-forming organs and certain disorders involving the immune mechanism: Secondary | ICD-10-CM | POA: Insufficient documentation

## 2012-12-13 DIAGNOSIS — Z641 Problems related to multiparity: Secondary | ICD-10-CM | POA: Insufficient documentation

## 2012-12-13 DIAGNOSIS — Z91040 Latex allergy status: Secondary | ICD-10-CM | POA: Insufficient documentation

## 2012-12-13 DIAGNOSIS — Z882 Allergy status to sulfonamides status: Secondary | ICD-10-CM | POA: Insufficient documentation

## 2012-12-13 DIAGNOSIS — O365990 Maternal care for other known or suspected poor fetal growth, unspecified trimester, not applicable or unspecified: Secondary | ICD-10-CM | POA: Insufficient documentation

## 2012-12-13 NOTE — Progress Notes (Signed)
Gwendolyn Glenn is being seen today for her first obstetrical visit at [redacted]w[redacted]d gestation by LMP and c/w [place] at [redacted]w[redacted]d.  She reports doing well overall, but is still grieving the SIDS death of infant daughter, Jeanelle Malling, in 10/2011 (Twin A of set delivered by C/S 07/2011, had IUGR). Other children doing well.  Did have SAB in late 2013 in 1st trimester.  Had [place] at [redacted]w[redacted]d for viability.  Declines pap during pregnancy due to hx of recurrent SABs.  Undecided regarding 17P.  Hasn't had any bleeding during pregnancy. Plans VBAC.  Her obstetrical history is significant for: Patient Active Problem List  Diagnosis  . Rh negative status during pregnancy  . Grand multigravida  . SIDS (sudden infant death syndrome) of daughter  . Multiple gestations x 2  . Pyelonephritis during 2003 pregnancy  . Hx of preterm delivery, currently pregnant  . Late prenatal care  . FH: hemophilia  . 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 Pregnancy complicated by previous recurrent miscarriages  . Pregnancy complicated by previous recurrent miscarriages  . Previous cesarean delivery  . Hx IUGR (intrauterine growth retardation) in 2012 pregnancy  . Latex allergy  . 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  Feeding plan:   Breast  Pregnancy history fully reviewed.  The following portions of the patient's history were reviewed and updated as appropriate: allergies, current medications, past family history, past medical history, past social history, past surgical history and problem list.  Review of Systems Pertinent ROS is described in HPI   Objective:   BP 100/58  Wt 164 lb (74.39 kg)  LMP 08/22/2012  Breastfeeding? Unknown Wt Readings from Last 1 Encounters:  12/08/2012 164 lb (74.39 kg)   BMI: There is no height on file to calculate BMI.  General: alert, cooperative and no distress HEENT: grossly normal  Thyroid: normal  Respiratory: clear to auscultation  bilaterally Cardiovascular: regular rate and rhythm,  Breasts:  No dominant masses, nipples erect Gastrointestinal: soft, non-tender; no masses,  no organomegaly Extremities: extremities normal, no pain or edema Vaginal Bleeding: None  EXTERNAL GENITALIA: normal appearing vulva with no masses, tenderness or lesions VAGINA: no abnormal discharge or lesions CERVIX: no lesions or cervical motion tenderness; cervix closed, long, firm UTERUS: gravid and consistent with 16 weeks ADNEXA: no masses palpable and nontender OB EXAM PELVIMETRY: appears adequate, proven to 5+9--declines pelvic today.  FHR:  150  bpm  Assessment:    Pregnancy at  16 weeks Previous C/S, desires VBAC (prior SVD x 2) Hx recurrent SABs Hx PTD (due to pyelonephritis) Rh negative Hx SIDS infant Grand multigravida Late prenatal care PCN/sulfa allergy Latex allergy Plan:     Prenatal panel reviewed and discussed with the patient:  yes  Pap smear collected:  no GC/Chlamydia collected:  no Wet prep:  None Discussion of Genetic testing options: Will decide regarding Quad screen at [place] Prenatal vitamins recommended Reviewed R&B of VBAC--consent given to patient to review. Discussed 17P--patient undecided, will review with [facility].  Plan of care: Next visit:  2-3 weeks for [place] for anatomy, will decide regarding Quad screen Other anticipated f/u:    Discussion with MD regarding VBAC Will see [facility] per patient's request--needs to decide regarding 17P. Support to patient for loss of child due to SIDS Will review patient at High Risk meeting on 12-21-12.   [name], CNM, [place]

## 2012-12-16 DIAGNOSIS — 419620001 Death: Secondary | SNOMED CT

## 2012-12-16 DEATH — deceased

## 2012-12-26 ENCOUNTER — Other Ambulatory Visit: Payer: Self-pay

## 2012-12-26 ENCOUNTER — Ambulatory Visit: Payer: Medicaid Other | Admitting: Obstetrics and Gynecology

## 2012-12-26 ENCOUNTER — Other Ambulatory Visit: Payer: Medicaid Other

## 2012-12-26 ENCOUNTER — Ambulatory Visit: Payer: Medicaid Other

## 2012-12-26 VITALS — BP 100/70 | Wt 168.0 lb

## 2012-12-26 DIAGNOSIS — O34219 Maternal care for unspecified type scar from previous cesarean delivery: Secondary | ICD-10-CM

## 2012-12-26 DIAGNOSIS — Z3689 Encounter for other specified antenatal screening: Secondary | ICD-10-CM

## 2012-12-26 DIAGNOSIS — O2620 Pregnancy care for patient with recurrent pregnancy loss, unspecified trimester: Secondary | ICD-10-CM

## 2012-12-26 NOTE — Progress Notes (Signed)
[redacted]w[redacted]d U/S c/w dates, cvx 3.6cm, 3v cord, nl fluid, post placenta, no anomalies Questions answered 1st tri screen neg - AFP today (I think pt may have left without being done - will send to triage to investigate) Rubella NI - discuss with pt at [facility] Consent for VBAC signed today - several questions answered Declines 17P - says h/o PTD was secondary to Twins and the other related to SROM after episode of Pyelo

## 2012-12-27 ENCOUNTER — Other Ambulatory Visit: Payer: Self-pay

## 2012-12-29 LAB — US OB COMP + 14 WK

## 2013-01-03 ENCOUNTER — Other Ambulatory Visit: Payer: Medicaid Other

## 2013-01-04 ENCOUNTER — Other Ambulatory Visit: Payer: Medicaid Other

## 2013-01-12 LAB — OB RESULTS CONSOLE GC/CHLAMYDIA
Chlamydia: NEGATIVE
Gonorrhea: NEGATIVE

## 2013-01-23 ENCOUNTER — Encounter: Payer: Medicaid Other | Admitting: Obstetrics and Gynecology

## 2013-02-01 ENCOUNTER — Ambulatory Visit: Payer: Medicaid Other | Admitting: Obstetrics and Gynecology

## 2013-02-01 VITALS — BP 102/72 | Wt 176.0 lb

## 2013-02-01 DIAGNOSIS — O09892 Supervision of other high risk pregnancies, second trimester: Secondary | ICD-10-CM

## 2013-02-01 NOTE — Progress Notes (Signed)
Pt stated no issues today.  Given PNV SAMPLES U/S [place] for s>d today, but hx of IUGR prior pregnancy 1hr glu [place]

## 2013-04-28 ENCOUNTER — Emergency Department (HOSPITAL_BASED_OUTPATIENT_CLINIC_OR_DEPARTMENT_OTHER): Payer: Medicaid Other

## 2013-04-28 ENCOUNTER — Encounter (HOSPITAL_BASED_OUTPATIENT_CLINIC_OR_DEPARTMENT_OTHER): Payer: Self-pay | Admitting: *Deleted

## 2013-04-28 ENCOUNTER — Emergency Department (HOSPITAL_BASED_OUTPATIENT_CLINIC_OR_DEPARTMENT_OTHER)
Admission: EM | Admit: 2013-04-28 | Discharge: 2013-04-28 | Disposition: A | Discharge status: Home / Self Care | Payer: Medicaid Other | Attending: Emergency Medicine | Admitting: Emergency Medicine

## 2013-04-28 DIAGNOSIS — Z91040 Latex allergy status: Secondary | ICD-10-CM | POA: Insufficient documentation

## 2013-04-28 DIAGNOSIS — Y9389 Activity, other specified: Secondary | ICD-10-CM | POA: Insufficient documentation

## 2013-04-28 DIAGNOSIS — Z880 Allergy status to penicillin: Secondary | ICD-10-CM | POA: Insufficient documentation

## 2013-04-28 DIAGNOSIS — O9989 Other specified diseases and conditions complicating pregnancy, childbirth and the puerperium: Secondary | ICD-10-CM | POA: Insufficient documentation

## 2013-04-28 DIAGNOSIS — Y929 Unspecified place or not applicable: Secondary | ICD-10-CM | POA: Insufficient documentation

## 2013-04-28 DIAGNOSIS — W010XXA Fall on same level from slipping, tripping and stumbling without subsequent striking against object, initial encounter: Secondary | ICD-10-CM | POA: Insufficient documentation

## 2013-04-28 DIAGNOSIS — O34219 Maternal care for unspecified type scar from previous cesarean delivery: Secondary | ICD-10-CM | POA: Insufficient documentation

## 2013-04-28 DIAGNOSIS — S99919A Unspecified injury of unspecified ankle, initial encounter: Secondary | ICD-10-CM | POA: Insufficient documentation

## 2013-04-28 DIAGNOSIS — Z79899 Other long term (current) drug therapy: Secondary | ICD-10-CM | POA: Insufficient documentation

## 2013-04-28 DIAGNOSIS — Z87891 Personal history of nicotine dependence: Secondary | ICD-10-CM | POA: Insufficient documentation

## 2013-04-28 DIAGNOSIS — S93602A Unspecified sprain of left foot, initial encounter: Secondary | ICD-10-CM

## 2013-04-28 DIAGNOSIS — S93609A Unspecified sprain of unspecified foot, initial encounter: Secondary | ICD-10-CM | POA: Insufficient documentation

## 2013-04-28 DIAGNOSIS — S8990XA Unspecified injury of unspecified lower leg, initial encounter: Secondary | ICD-10-CM | POA: Insufficient documentation

## 2013-04-28 NOTE — ED Provider Notes (Signed)
History     CSN: [id number]  Arrival date & time 04/28/13  1124   First MD Initiated Contact with Patient 04/28/13 1150      Chief Complaint  Patient presents with  . Foot Injury    (Consider location/radiation/quality/duration/timing/severity/associated sxs/prior treatment) HPI Comments: Patient is [redacted] weeks pregnant.  She was walking three weeks ago and tripped and inverted her left foot.  She has had pain with ambulation ever since.  It suddenly worsened two days ago without new injury.    Patient is a 32 y.o. female presenting with foot injury. The history is provided by the patient.  Foot Injury Location:  Foot Time since incident:  3 weeks Injury: yes   Mechanism of injury: fall   Foot location:  L foot Pain details:    Quality:  Shooting   Radiates to:  Does not radiate   Severity:  Moderate   Past Medical History  Diagnosis Date  . Abnormal Pap smear     Past Surgical History  Procedure Laterality Date  . Wisdom tooth extraction    . Dilation and curettage of uterus      11weeks demise  . Cesarean section  08/05/2011    Procedure: CESAREAN SECTION;  Surgeon: Esmeralda Arthur, MD;  Location: [facility];  Service: Gynecology;  Laterality: N/A;  Primary Cesarean Section with Delivery  Twins  Baby A Boy @ 1142, Baby B Girl @ 1144    No family history on file.  History  Substance Use Topics  . Smoking status: Former Smoker -- 0.25 packs/day for 10 years    Types: Cigarettes  . Smokeless tobacco: Never Used  . Alcohol Use: No    OB History   Grav Para Term Preterm Abortions TAB SAB Ect Mult Living   14 3 0 3 10 0 10 0 2 5       Review of Systems  All other systems reviewed and are negative.    Allergies  Penicillins; Sulfa antibiotics; and Latex  Home Medications   Current Outpatient Rx  Name  Route  Sig  Dispense  Refill  . acetaminophen (TYLENOL) 325 MG tablet   Oral   Take 650 mg by mouth every 6 (six) hours as needed.           Marland Kitchen EXPIRED:  ferrous sulfate 325 (65 FE) MG tablet   Oral   Take 1 tablet (325 mg total) by mouth daily with breakfast.   30 tablet   1   . flintstones complete (FLINTSTONES) 60 MG chewable tablet   Oral   Chew 2 tablets by mouth daily.           . NONFORMULARY OR COMPOUNDED ITEM   Vaginal   Place 25 mg vaginally 2 (two) times daily.   14 each   6     Progesterone Suppository 25 mg. One vaginally bid   . EXPIRED: norethindrone (ORTHO MICRONOR) 0.35 MG tablet   Oral   Take 1 tablet (0.35 mg total) by mouth daily.   1 Package   11     Begin on Sunday, August 22, 2011.  Take at the sam ...   . ondansetron (ZOFRAN) 8 MG tablet   Oral   Take 1 tablet (8 mg total) by mouth every 8 (eight) hours as needed for nausea.   20 tablet   4   . Prenatal MV-Min-Fe Fum-FA-DHA (CENTRUM SPECIALIST PRENATAL) 27-0.8 & 200 MG MISC   Oral   Take 1 tablet  by mouth every morning.   90 each   4   . progesterone (PROMETRIUM) 200 MG capsule   Oral   Take 1 capsule (200 mg total) by mouth daily.   7 capsule   6   . promethazine (PHENERGAN) 50 MG tablet   Oral   Take 0.5 tablets (25 mg total) by mouth every 8 (eight) hours as needed for nausea.   30 tablet   1     BP 110/68  Pulse 106  Temp(Src) 98.5 F (36.9 C) (Oral)  Resp 20  SpO2 100%  LMP 08/22/2012  Physical Exam  Nursing note and vitals reviewed. Constitutional: She is oriented to person, place, and time. She appears well-developed and well-nourished. No distress.  HENT:  Head: Normocephalic and atraumatic.  Mouth/Throat: Oropharynx is clear and moist.  Neck: Normal range of motion. Neck supple.  Musculoskeletal: Normal range of motion.  The left foot appears grossly normal.  There is ttp over the proximal dorsal aspect.  There is mild swelling in this area.  Neurological: She is alert and oriented to person, place, and time.  Skin: Skin is warm and dry. She is not diaphoretic.    ED Course  Procedures (including critical care  time)  Labs Reviewed - No data to display No results found.   No diagnosis found.    MDM  Xrays negative for fracture.  Will treat as sprain.  Follow up prn.        Geoffery Lyons, MD 04/28/13 1230

## 2013-04-28 NOTE — ED Notes (Addendum)
Fell and twisted her left foot a few weeks ago. Still having pain and needs to take tylenol for the pain. Patient is [redacted] weeks pregnant.

## 2013-05-30 ENCOUNTER — Telehealth (HOSPITAL_COMMUNITY): Payer: Self-pay | Admitting: *Deleted

## 2013-05-30 NOTE — Telephone Encounter (Signed)
Preadmission screen  

## 2013-06-04 ENCOUNTER — Encounter (HOSPITAL_COMMUNITY): Payer: Self-pay | Admitting: Anesthesiology

## 2013-06-04 ENCOUNTER — Encounter (HOSPITAL_COMMUNITY): Payer: Self-pay | Admitting: *Deleted

## 2013-06-04 ENCOUNTER — Inpatient Hospital Stay (HOSPITAL_COMMUNITY): Payer: Medicaid Other | Admitting: Anesthesiology

## 2013-06-04 ENCOUNTER — Inpatient Hospital Stay (HOSPITAL_COMMUNITY)
Admission: AD | Admit: 2013-06-04 | Discharge: 2013-06-06 | DRG: 775 | Disposition: A | Discharge status: Home / Self Care | Payer: Medicaid Other | Source: Ambulatory Visit | Attending: Obstetrics and Gynecology | Admitting: Obstetrics and Gynecology

## 2013-06-04 DIAGNOSIS — Z283 Underimmunization status: Secondary | ICD-10-CM

## 2013-06-04 DIAGNOSIS — O09899 Supervision of other high risk pregnancies, unspecified trimester: Secondary | ICD-10-CM

## 2013-06-04 DIAGNOSIS — Z880 Allergy status to penicillin: Secondary | ICD-10-CM

## 2013-06-04 DIAGNOSIS — Z882 Allergy status to sulfonamides status: Secondary | ICD-10-CM

## 2013-06-04 DIAGNOSIS — O34219 Maternal care for unspecified type scar from previous cesarean delivery: Secondary | ICD-10-CM | POA: Diagnosis not present

## 2013-06-04 DIAGNOSIS — IMO0001 Reserved for inherently not codable concepts without codable children: Secondary | ICD-10-CM

## 2013-06-04 LAB — CBC
MCV: 77.9 fL — ABNORMAL LOW (ref 78.0–100.0)
Platelets: 211 10*3/uL (ref 150–400)
RBC: 4.17 MIL/uL (ref 3.87–5.11)
RDW: 15.1 % (ref 11.5–15.5)
WBC: 13 10*3/uL — ABNORMAL HIGH (ref 4.0–10.5)

## 2013-06-04 MED ORDER — PROMETHAZINE HCL 25 MG/ML IJ SOLN
12.5000 mg | Freq: Once | INTRAMUSCULAR | Status: AC
  Administered 2013-06-04: 12.5 mg via INTRAVENOUS
  Filled 2013-06-04: qty 1

## 2013-06-04 MED ORDER — ACETAMINOPHEN 325 MG PO TABS: 650.0000 mg | ORAL_TABLET | ORAL | Status: DC | PRN

## 2013-06-04 MED ORDER — CITRIC ACID-SODIUM CITRATE 334-500 MG/5ML PO SOLN: 30.0000 mL | ORAL | Status: DC | PRN

## 2013-06-04 MED ORDER — EPHEDRINE 5 MG/ML INJ
10.0000 mg | INTRAVENOUS | Status: DC | PRN
  Filled 2013-06-04: qty 2

## 2013-06-04 MED ORDER — TERBUTALINE SULFATE 1 MG/ML IJ SOLN: 0.2500 mg | Freq: Once | INTRAMUSCULAR | Status: AC | PRN

## 2013-06-04 MED ORDER — ONDANSETRON HCL 4 MG/2ML IJ SOLN: 4.0000 mg | Freq: Four times a day (QID) | INTRAMUSCULAR | Status: DC | PRN

## 2013-06-04 MED ORDER — IBUPROFEN 600 MG PO TABS
600.0000 mg | ORAL_TABLET | Freq: Four times a day (QID) | ORAL | Status: DC | PRN
  Administered 2013-06-05: 600 mg via ORAL
  Filled 2013-06-04: qty 1

## 2013-06-04 MED ORDER — FENTANYL 2.5 MCG/ML BUPIVACAINE 1/10 % EPIDURAL INFUSION (WH - ANES)
14.0000 mL/h | INTRAMUSCULAR | Status: DC | PRN
  Filled 2013-06-04: qty 125

## 2013-06-04 MED ORDER — LIDOCAINE HCL (PF) 1 % IJ SOLN
INTRAMUSCULAR | Status: DC | PRN
  Administered 2013-06-04 (×2): 9 mL

## 2013-06-04 MED ORDER — PHENYLEPHRINE 40 MCG/ML (10ML) SYRINGE FOR IV PUSH (FOR BLOOD PRESSURE SUPPORT)
80.0000 ug | PREFILLED_SYRINGE | INTRAVENOUS | Status: DC | PRN
  Filled 2013-06-04: qty 2
  Filled 2013-06-04: qty 5

## 2013-06-04 MED ORDER — LACTATED RINGERS IV SOLN
500.0000 mL | Freq: Once | INTRAVENOUS | Status: AC
  Administered 2013-06-04: 1000 mL via INTRAVENOUS

## 2013-06-04 MED ORDER — FENTANYL 2.5 MCG/ML BUPIVACAINE 1/10 % EPIDURAL INFUSION (WH - ANES)
INTRAMUSCULAR | Status: DC | PRN
  Administered 2013-06-04: 14 mL/h via EPIDURAL

## 2013-06-04 MED ORDER — OXYCODONE-ACETAMINOPHEN 5-325 MG PO TABS: 1.0000 | ORAL_TABLET | ORAL | Status: DC | PRN

## 2013-06-04 MED ORDER — OXYTOCIN 40 UNITS IN LACTATED RINGERS INFUSION - SIMPLE MED
62.5000 mL/h | INTRAVENOUS | Status: DC
  Administered 2013-06-05: 62.5 mL/h via INTRAVENOUS

## 2013-06-04 MED ORDER — DIPHENHYDRAMINE HCL 50 MG/ML IJ SOLN: 12.5000 mg | INTRAMUSCULAR | Status: DC | PRN

## 2013-06-04 MED ORDER — BUTORPHANOL TARTRATE 1 MG/ML IJ SOLN
1.0000 mg | Freq: Once | INTRAMUSCULAR | Status: AC
  Administered 2013-06-04: 1 mg via INTRAVENOUS
  Filled 2013-06-04: qty 1

## 2013-06-04 MED ORDER — LACTATED RINGERS IV SOLN
INTRAVENOUS | Status: DC
  Administered 2013-06-04 (×4): via INTRAVENOUS

## 2013-06-04 MED ORDER — LACTATED RINGERS IV SOLN: 500.0000 mL | INTRAVENOUS | Status: DC | PRN

## 2013-06-04 MED ORDER — LIDOCAINE HCL (PF) 1 % IJ SOLN
30.0000 mL | INTRAMUSCULAR | Status: DC | PRN
  Filled 2013-06-04 (×2): qty 30

## 2013-06-04 MED ORDER — EPHEDRINE 5 MG/ML INJ
10.0000 mg | INTRAVENOUS | Status: DC | PRN
  Filled 2013-06-04: qty 2
  Filled 2013-06-04: qty 4

## 2013-06-04 MED ORDER — PHENYLEPHRINE 40 MCG/ML (10ML) SYRINGE FOR IV PUSH (FOR BLOOD PRESSURE SUPPORT)
80.0000 ug | PREFILLED_SYRINGE | INTRAVENOUS | Status: DC | PRN
  Filled 2013-06-04: qty 2

## 2013-06-04 MED ORDER — OXYTOCIN 40 UNITS IN LACTATED RINGERS INFUSION - SIMPLE MED
1.0000 m[IU]/min | INTRAVENOUS | Status: DC
  Administered 2013-06-04: 1 m[IU]/min via INTRAVENOUS
  Filled 2013-06-04: qty 1000

## 2013-06-04 MED ORDER — OXYTOCIN BOLUS FROM INFUSION
500.0000 mL | INTRAVENOUS | Status: DC
  Administered 2013-06-04: 500 mL via INTRAVENOUS

## 2013-06-04 NOTE — MAU Note (Signed)
Pt C/O uc's "for days", states they are more intense today, denies bleeding or LOF.

## 2013-06-04 NOTE — H&P (Signed)
Gwendolyn Glenn is a 32 y.o. female, G14P03 10 5 at [redacted]w[redacted]d weeks, presenting for UCs for the last couple of days, getting more intense and closer together (Q2-3 min) this morning around 0600. Denies VB, LOF, recent fever, resp or GI c/o's, UTI or PIH s/s. GFM.  Scheduled for IOL tomorrow.  Patient Active Problem List   Diagnosis Date Noted  . Grand multigravida Jan 06, 2013  . SIDS (sudden infant death syndrome) of daughter 2013/01/06  . Multiple gestations x 2 01-06-13  . Pyelonephritis during 2003 pregnancy 06-Jan-2013  . Hx of preterm delivery, currently pregnant 01-06-13  . Late prenatal care 01-06-2013  . FH: hemophilia 2013-01-06  . Allergy to penicillin 01/06/13  . Pregnancy complicated by previous recurrent miscarriages 2013-01-06  . Pregnancy complicated by previous recurrent miscarriages 2013-01-06  . Previous cesarean delivery - consent vor TOLAC signed 12/26/12 06-Jan-2013  . Hx IUGR (intrauterine growth retardation) in 2012 pregnancy - rec u/s at 28-30wks 06-Jan-2013  . Latex allergy 01/06/13  . Allergy to sulfa drugs 01/06/13  . Rh negative status during pregnancy 11/29/2012    History of present pregnancy: Patient entered care at 8 weeks.   EDC of 05/29/13 was established by LMP.   Anatomy scan:  18 weeks, with normal findings and an posterior placenta.   Additional [place] evaluations:  At [redacted]w[redacted]d for growth - EFW 72%ile, AFI 65%ile.   Significant prenatal events:  none   Last evaluation:  05/29/13 at [redacted]w[redacted]d  OB History   Grav Para Term Preterm Abortions TAB SAB Ect Mult Living   14 3 0 3 10 0 10 0 2 5      Past Medical History  Diagnosis Date  . Abnormal Pap smear    Past Surgical History  Procedure Laterality Date  . Wisdom tooth extraction    . Dilation and curettage of uterus      11weeks demise  . Cesarean section  08/05/2011    Procedure: CESAREAN SECTION;  Surgeon: Esmeralda Arthur, MD;  Location: [facility];  Service: Gynecology;  Laterality: N/A;  Primary  Cesarean Section with Delivery  Twins  Baby A Boy @ 1142, Baby B Girl @ 46   Family History: family history is not on file. Social History:  reports that she has quit smoking. Her smoking use included Cigarettes. She has a 2.5 pack-year smoking history. She has never used smokeless tobacco. She reports that she does not drink alcohol or use illicit drugs.   Prenatal Transfer Tool  Maternal Diabetes: No Genetic Screening: Declined Maternal Ultrasounds/Referrals: Normal Fetal Ultrasounds or other Referrals:  None Maternal Substance Abuse:  No Significant Maternal Medications:  None Significant Maternal Lab Results: Lab values include: Group B Strep negative, Rh negative    ROS: see HPI above, all other systems are negative  Allergies  Allergen Reactions  . Penicillins Hives    Can take amoxicillin without difficulty  . Sulfa Antibiotics Other (See Comments)    Childhood allergy; reaction unknown  . Latex Rash     Dilation: 4 Effacement (%): 60 Station: -1 Exam by:: J. Britian Jentz CNM Blood pressure 113/81, pulse 95, temperature 98.2 F (36.8 C), temperature source Oral, resp. rate 20, height 5\' 9"  (1.753 m), weight 192 lb (87.091 kg), last menstrual period 08/22/2012.  Chest clear Heart RRR without murmur Abd gravid, NT Ext: WNL  FHR: NST reactive UCs:  Q 5 min  Prenatal labs: ABO, Rh: A/NEG/-- (01/10 1122) Antibody: NEG (01/10 1122) Rubella:   NON IMMUNE RPR: NON REAC (  01/10 1122)  HBsAg: NEGATIVE (01/10 1122)  HIV: NON REACTIVE (01/10 1122)  GBS: Negative (07/16 0000) Sickle cell/Hgb electrophoresis:  n/a Pap:  unknown GC:  unknown Chlamydia:  unknown Genetic screenings:  declined Glucola:  99 Other:  none  Assessment/Plan: IUP at [redacted]w[redacted]d Active labor GBS neg Grand multigravida G1 and G2 SVD, G3 C/S - desires TOLAC, consent signed Rubella non-immune  Admit to BS per c/w Dr. Estanislado Pandy as attending MD Routine [facility] orders Epidural when requested   Rowan Blase, MSN 06/04/2013, 11:17 AM

## 2013-06-04 NOTE — Anesthesia Procedure Notes (Signed)
Epidural Patient location during procedure: OB Start time: 06/04/2013 6:37 PM End time: 06/04/2013 6:42 PM  Staffing Anesthesiologist: Sandrea Hughs Performed by: anesthesiologist   Preanesthetic Checklist Completed: patient identified, surgical consent, pre-op evaluation, timeout performed, IV checked, risks and benefits discussed and monitors and equipment checked  Epidural Patient position: sitting Prep: site prepped and draped and DuraPrep Patient monitoring: continuous pulse ox and blood pressure Approach: midline Injection technique: LOR air  Needle:  Needle type: Tuohy  Needle gauge: 17 G Needle length: 9 cm and 9 Needle insertion depth: 7 cm Catheter type: closed end flexible Catheter size: 19 Gauge Catheter at skin depth: 12 cm Test dose: negative and Other  Assessment Sensory level: T9 Events: blood not aspirated, injection not painful, no injection resistance, negative IV test and no paresthesia  Additional Notes Reason for block:procedure for pain

## 2013-06-04 NOTE — Progress Notes (Signed)
  Subjective: Pt sitting up in bed.  Breathing through UCs.  Pt voiced concerns about the possibility that her cervix may not be changing particularly because her UCs are spacing out even though they are getting stronger.  She is also concerned about knowing when to get an epidural.  She wants to be actively changing her cervix before getting the epidural so she is not "stuck in the bed"  She also reports concerns about initiating Pitocin as she knows this will increase the intensity of her UCs.  Discussed at length the different options.  Objective: BP 120/73  Pulse 91  Temp(Src) 98.4 F (36.9 C) (Oral)  Resp 20  Ht 5\' 9"  (1.753 m)  Wt 192 lb (87.091 kg)  BMI 28.34 kg/m2  LMP 08/22/2012      FHT:  Cat I UC:   regular, every 4-8 minutes  SVE:   Dilation: 4 Effacement (%): 60 Station: -1 Exam by:: J Samaia Iwata CNM  Assessment / Plan:  Labor: No cervical change; early vs active labor Preeclampsia: no s/s Fetal Wellbeing: Cat I Pain Control: Breathing and relaxation I/D: GBS neg; Intact Anticipated MOD: SVD  POC to give Stadol now to allow rest, recheck cervix around 1600, and re-evaluate the need for Pitocin at that time.  Pt and husband in agreement.  Haroldine Laws 06/04/2013, 2:40 PM

## 2013-06-04 NOTE — MAU Note (Signed)
IV attempt x 1, unsuccessful, L forearm.

## 2013-06-04 NOTE — Anesthesia Preprocedure Evaluation (Signed)
Anesthesia Evaluation  Patient identified by MRN, date of birth, ID band Patient awake    Reviewed: Allergy & Precautions, H&P , NPO status , Patient's Chart, lab work & pertinent test results  Airway Mallampati: I TM Distance: >3 FB Neck ROM: full    Dental no notable dental hx.    Pulmonary neg pulmonary ROS,    Pulmonary exam normal       Cardiovascular negative cardio ROS      Neuro/Psych negative neurological ROS  negative psych ROS   GI/Hepatic negative GI ROS, Neg liver ROS,   Endo/Other  negative endocrine ROS  Renal/GU   negative genitourinary   Musculoskeletal negative musculoskeletal ROS (+)   Abdominal Normal abdominal exam  (+)   Peds negative pediatric ROS (+)  Hematology negative hematology ROS (+)   Anesthesia Other Findings   Reproductive/Obstetrics (+) Pregnancy                           Anesthesia Physical Anesthesia Plan  ASA: II  Anesthesia Plan: Epidural   Post-op Pain Management:    Induction:   Airway Management Planned:   Additional Equipment:   Intra-op Plan:   Post-operative Plan:   Informed Consent: I have reviewed the patients History and Physical, chart, labs and discussed the procedure including the risks, benefits and alternatives for the proposed anesthesia with the patient or authorized representative who has indicated his/her understanding and acceptance.     Plan Discussed with:   Anesthesia Plan Comments:         Anesthesia Quick Evaluation

## 2013-06-04 NOTE — Progress Notes (Signed)
  Subjective: Pt reported getting good sleep.  Pt laying in bed breathing through UCs. Pt reports UCs are spacing out.  Objective: BP 120/73  Pulse 91  Temp(Src) 98.4 F (36.9 C) (Oral)  Resp 20  Ht 5\' 9"  (1.753 m)  Wt 192 lb (87.091 kg)  BMI 28.34 kg/m2  LMP 08/22/2012      FHT:  Cat I UC:   regular, every 9-12 minutes  SVE:   Dilation: 4 Effacement (%): 60 Station: -1 Exam by:: Haroldine Laws, CNM posterior  Assessment / Plan:  Labor: Early labor Preeclampsia: no s/s Fetal Wellbeing: Cat I Pain Control: Breathing and relaxation; pt plans to start pitocin and then ask for the epidural when she is ready I/D: GBS neg; intact Anticipated MOD: SVD  Initiate pitocin augmentation.  Haroldine Laws 06/04/2013, 4:19 PM

## 2013-06-04 NOTE — Progress Notes (Signed)
  Subjective: Feeling some pressure.  Family at bedside.  Objective: BP 129/53  Pulse 72  Temp(Src) 98.7 F (37.1 C) (Oral)  Resp 18  Ht 5\' 9"  (1.753 m)  Wt 192 lb (87.091 kg)  BMI 28.34 kg/m2  SpO2 99%  LMP 08/22/2012      FHT:  Category 1 UC:   regular, every 3 minutes SVE:   Dilation: 8 Effacement (%): 100 Station: 0 Exam by:: [email or web address]  Assessment / Plan: Progressive labor Will await increased pressure.  [name] 06/04/2013, 9:31 PM

## 2013-06-04 NOTE — Progress Notes (Addendum)
  Subjective: Comfortable with epidural.  Objective: BP 129/53  Pulse 72  Temp(Src) 98.7 F (37.1 C) (Oral)  Resp 18  Ht 5\' 9"  (1.753 m)  Wt 192 lb (87.091 kg)  BMI 28.34 kg/m2  SpO2 99%  LMP 08/22/2012      FHT:  Category 1 UC:   irregular, every 3-5 minutes SVE:   5-6 cm, 80%, vtx, 0 station, anterior BBOW--AROM, clear fluid Pitocin on 5 mu/min  Assessment / Plan: Progressive labor Prior C/S, with prior SVB--planning VBAC Will CTO Continue pitocin augmentation  Cashay Manganelli 06/04/2013,7:45p

## 2013-06-05 ENCOUNTER — Inpatient Hospital Stay (HOSPITAL_COMMUNITY): Admission: RE | Admit: 2013-06-05 | Payer: Medicaid Other | Source: Ambulatory Visit

## 2013-06-05 ENCOUNTER — Encounter (HOSPITAL_COMMUNITY): Payer: Self-pay | Admitting: *Deleted

## 2013-06-05 LAB — CBC
Hemoglobin: 9.1 g/dL — ABNORMAL LOW (ref 12.0–15.0)
MCH: 25.2 pg — ABNORMAL LOW (ref 26.0–34.0)
MCV: 77.8 fL — ABNORMAL LOW (ref 78.0–100.0)
RBC: 3.61 MIL/uL — ABNORMAL LOW (ref 3.87–5.11)
WBC: 13.4 10*3/uL — ABNORMAL HIGH (ref 4.0–10.5)

## 2013-06-05 MED ORDER — SIMETHICONE 80 MG PO CHEW: 80.0000 mg | CHEWABLE_TABLET | ORAL | Status: DC | PRN

## 2013-06-05 MED ORDER — ONDANSETRON HCL 4 MG PO TABS: 4.0000 mg | ORAL_TABLET | ORAL | Status: DC | PRN

## 2013-06-05 MED ORDER — TETANUS-DIPHTH-ACELL PERTUSSIS 5-2.5-18.5 LF-MCG/0.5 IM SUSP
0.5000 mL | Freq: Once | INTRAMUSCULAR | Status: AC
  Administered 2013-06-05: 0.5 mL via INTRAMUSCULAR
  Filled 2013-06-05: qty 0.5

## 2013-06-05 MED ORDER — DIPHENHYDRAMINE HCL 25 MG PO CAPS: 25.0000 mg | ORAL_CAPSULE | Freq: Four times a day (QID) | ORAL | Status: DC | PRN

## 2013-06-05 MED ORDER — LANOLIN HYDROUS EX OINT: TOPICAL_OINTMENT | CUTANEOUS | Status: DC | PRN

## 2013-06-05 MED ORDER — POLYSACCHARIDE IRON COMPLEX 150 MG PO CAPS
150.0000 mg | ORAL_CAPSULE | Freq: Every day | ORAL | Status: DC
  Administered 2013-06-05 – 2013-06-06 (×2): 150 mg via ORAL
  Filled 2013-06-05 (×2): qty 1

## 2013-06-05 MED ORDER — BENZOCAINE-MENTHOL 20-0.5 % EX AERO: 1.0000 "application " | INHALATION_SPRAY | CUTANEOUS | Status: DC | PRN

## 2013-06-05 MED ORDER — IBUPROFEN 600 MG PO TABS
600.0000 mg | ORAL_TABLET | Freq: Four times a day (QID) | ORAL | Status: DC
  Administered 2013-06-05 – 2013-06-06 (×5): 600 mg via ORAL
  Filled 2013-06-05 (×5): qty 1

## 2013-06-05 MED ORDER — ONDANSETRON HCL 4 MG/2ML IJ SOLN: 4.0000 mg | INTRAMUSCULAR | Status: DC | PRN

## 2013-06-05 MED ORDER — ZOLPIDEM TARTRATE 5 MG PO TABS: 5.0000 mg | ORAL_TABLET | Freq: Every evening | ORAL | Status: DC | PRN

## 2013-06-05 MED ORDER — SENNOSIDES-DOCUSATE SODIUM 8.6-50 MG PO TABS
2.0000 | ORAL_TABLET | Freq: Every day | ORAL | Status: DC
  Administered 2013-06-05: 2 via ORAL

## 2013-06-05 MED ORDER — OXYCODONE-ACETAMINOPHEN 5-325 MG PO TABS
1.0000 | ORAL_TABLET | ORAL | Status: DC | PRN
  Administered 2013-06-05 (×2): 2 via ORAL
  Administered 2013-06-05 – 2013-06-06 (×2): 1 via ORAL
  Filled 2013-06-05: qty 1
  Filled 2013-06-05: qty 2
  Filled 2013-06-05: qty 1
  Filled 2013-06-05: qty 2

## 2013-06-05 MED ORDER — WITCH HAZEL-GLYCERIN EX PADS: 1.0000 "application " | MEDICATED_PAD | CUTANEOUS | Status: DC | PRN

## 2013-06-05 MED ORDER — DIBUCAINE 1 % RE OINT: 1.0000 "application " | TOPICAL_OINTMENT | RECTAL | Status: DC | PRN

## 2013-06-05 MED ORDER — MEASLES, MUMPS & RUBELLA VAC ~~LOC~~ INJ
0.5000 mL | INJECTION | Freq: Once | SUBCUTANEOUS | Status: AC
  Administered 2013-06-06: 0.5 mL via SUBCUTANEOUS
  Filled 2013-06-05 (×2): qty 0.5

## 2013-06-05 MED ORDER — PRENATAL MULTIVITAMIN CH
1.0000 | ORAL_TABLET | Freq: Every day | ORAL | Status: DC
  Administered 2013-06-05 – 2013-06-06 (×2): 1 via ORAL
  Filled 2013-06-05 (×2): qty 1

## 2013-06-05 MED ORDER — RHO D IMMUNE GLOBULIN 1500 UNIT/2ML IJ SOLN
300.0000 ug | Freq: Once | INTRAMUSCULAR | Status: AC
  Administered 2013-06-05: 300 ug via INTRAMUSCULAR
  Filled 2013-06-05: qty 2

## 2013-06-05 NOTE — Progress Notes (Signed)
Called to evaluate patient complaining of persistent numbness right leg. Patient had Lumbar epidural for vaginal delivery last night. She pushed with her hips in the hyperflexed position. She complains of numbness in her right lateral thigh in the distribution of her right lateral femoral cutaneous nerve. This is most likely due to neuropraxia from compression of the LFCN as it passes under the inguinal ligament. It is unrelated to her lumbar epidural. Patient reassured. Her symptoms should resolve over the next several days. Discussed with patient's RN.

## 2013-06-05 NOTE — Progress Notes (Signed)
Post Partum Day 1: S/P SVD with no repair  Subjective: Patient up ad lib, denies syncope or dizziness.  C/o not being able to sleep. Feeding:  breastfeeding Contraceptive plan:   unknown  Objective: Blood pressure 103/70, pulse 72, temperature 97.9 F (36.6 C), temperature source Oral, resp. rate 18, height 5\' 9"  (1.753 m), weight 192 lb (87.091 kg), last menstrual period 08/22/2012, SpO2 99.00%, unknown if currently breastfeeding.  Physical Exam:  General: alert, cooperative and no distress Lochia: appropriate Uterine Fundus: firm Incision: healing well DVT Evaluation: No evidence of DVT seen on physical exam. Negative Homan's sign.   Recent Labs  06/04/13 1235 06/05/13 0610  HGB 10.6* 9.1*  HCT 32.5* 28.1*    Assessment/Plan: S/P Vaginal delivery day 1 Rx Fe QD Continue current care Plan for discharge tomorrow   LOS: 1 day   Gwendolyn Glenn 06/05/2013, 5:06 PM

## 2013-06-05 NOTE — Anesthesia Postprocedure Evaluation (Signed)
  Anesthesia Post-op Note  Patient: Gwendolyn Glenn  Procedure(s) Performed: * No procedures listed *  Patient Location: PACU and Mother/Baby  Anesthesia Type:Epidural  Level of Consciousness: awake, alert  and oriented  Airway and Oxygen Therapy: Patient Spontanous Breathing  Post-op Pain: mild  Post-op Assessment: Patient's Cardiovascular Status Stable, Respiratory Function Stable, No signs of Nausea or vomiting, Adequate PO intake, Pain level controlled, No headache, No backache and No residual motor weakness  Post-op Vital Signs: stable  Complications: No apparent anesthesia complicationssome numbness right thigh. Asked her to have RN call anesthesia if numbness does not resolve Have RN call us

## 2013-06-06 LAB — RH IG WORKUP (INCLUDES ABO/RH)
Antibody Screen: NEGATIVE
Fetal Screen: NEGATIVE
Unit division: 0

## 2013-06-06 MED ORDER — IBUPROFEN 600 MG PO TABS: 600.0000 mg | ORAL_TABLET | Freq: Four times a day (QID) | ORAL | Status: DC | PRN

## 2013-06-06 MED ORDER — OXYCODONE-ACETAMINOPHEN 5-325 MG PO TABS: 1.0000 | ORAL_TABLET | ORAL | Status: DC | PRN

## 2013-06-06 NOTE — Discharge Summary (Signed)
  Vaginal Delivery Discharge Summary  Gwendolyn Glenn  DOB:    [birth year] MRN:    [id number] CSN:    [id number]  Date of admission:                  06/04/13  Date of discharge:                   06/06/13  Procedures this admission:  Date of Delivery: 06/04/13  Newborn Data:  Live born female  Birth Weight: 8 lb 8.7 oz (3875 g) APGAR: 9, 9  Home with mother. Name: Gwendolyn Glenn Circumcision Plan: Oupatient  History of Present Illness:  Ms. Gwendolyn Glenn is a 32 y.o. female, [id number], who presents at [redacted]w[redacted]d weeks gestation. The patient has been followed at the [facility] division [facility]. She was admitted onset of labor. Her pregnancy has been complicated by:  Patient Active Problem List   Diagnosis Date Noted  . Rubella non-immune status, antepartum 06/04/2013  . VBAC, delivered 06/04/2013  . Grand multigravida 2012/12/30  . SIDS (sudden infant death syndrome) of daughter 12/30/12  . Multiple gestations x 2 2012-12-30  . Pyelonephritis during 2003 pregnancy 30-Dec-2012  . Hx of preterm delivery, currently pregnant 12-30-12  . Late prenatal care 2012-12-30  . FH: hemophilia 12-30-12  . Allergy to penicillin 30-Dec-2012  . Pregnancy complicated by previous recurrent miscarriages December 30, 2012  . Pregnancy complicated by previous recurrent miscarriages 2012-12-30  . Previous cesarean delivery - consent vor TOLAC signed 12/26/12 2012/12/30  . Hx IUGR (intrauterine growth retardation) in 2012 pregnancy - rec u/s at 28-30wks 30-Dec-2012  . Latex allergy 2012-12-30  . Allergy to sulfa drugs 12-30-12  . Rh negative status during pregnancy 11/29/2012     Hospital course:  The patient was admitted in early labor.   Her labor was not complicated. She proceeded to have a vaginal delivery of a healthy infant. Her delivery was not complicated. Her postpartum course was not complicated. She was discharged to home on postpartum day  2 doing well.  Feeding:  breast  Contraception:  Considering vasectomy  Discharge hemoglobin:  Hemoglobin  Date Value Range Status  06/05/2013 9.1* 12.0 - 15.0 g/dL Final     HCT  Date Value Range Status  06/05/2013 28.1* 36.0 - 46.0 % Final    Discharge Physical Exam:   General: alert Lochia: appropriate Uterine Fundus: firm Incision: healing well DVT Evaluation: No evidence of DVT seen on physical exam. Negative Homan's sign.  Intrapartum Procedures: spontaneous vaginal delivery Postpartum Procedures: Rho(D) Ig Complications-Operative and Postpartum: none  Discharge Diagnoses: Term Pregnancy-delivered  Discharge Information:  Activity:           Per [facility] handout Diet:                routine Medications: Ibuprofen and Percocet Condition:      stable Instructions:  refer to practice specific booklet Discharge to: home     [name] 06/06/2013

## 2014-09-16 ENCOUNTER — Encounter (HOSPITAL_COMMUNITY): Payer: Self-pay | Admitting: *Deleted

## 2015-03-12 ENCOUNTER — Encounter (HOSPITAL_BASED_OUTPATIENT_CLINIC_OR_DEPARTMENT_OTHER): Payer: Self-pay | Admitting: Emergency Medicine

## 2015-03-12 ENCOUNTER — Emergency Department (HOSPITAL_BASED_OUTPATIENT_CLINIC_OR_DEPARTMENT_OTHER)
Admission: EM | Admit: 2015-03-12 | Discharge: 2015-03-12 | Discharge status: Against Medical Advice | Payer: Medicaid Other | Attending: Emergency Medicine | Admitting: Emergency Medicine

## 2015-03-12 ENCOUNTER — Emergency Department (HOSPITAL_BASED_OUTPATIENT_CLINIC_OR_DEPARTMENT_OTHER): Payer: Medicaid Other

## 2015-03-12 DIAGNOSIS — Z87891 Personal history of nicotine dependence: Secondary | ICD-10-CM | POA: Insufficient documentation

## 2015-03-12 DIAGNOSIS — R200 Anesthesia of skin: Secondary | ICD-10-CM | POA: Insufficient documentation

## 2015-03-12 DIAGNOSIS — Z91040 Latex allergy status: Secondary | ICD-10-CM | POA: Diagnosis not present

## 2015-03-12 DIAGNOSIS — Z880 Allergy status to penicillin: Secondary | ICD-10-CM | POA: Insufficient documentation

## 2015-03-12 NOTE — ED Provider Notes (Signed)
CSN: [id number]     Arrival date & time 03/12/15  0007 History   First MD Initiated Contact with Patient 03/12/15 [phone]     Chief Complaint  Patient presents with  . Numbness     (Consider location/radiation/quality/duration/timing/severity/associated sxs/prior Treatment) HPI  This is a 34 year old female with a one-week history of paresthesias of the face. Specifically the paresthesias are located on the lateral aspect of the right periorbital region and the lateral aspect of the right perioral region. She has also noticed that her right eyebrow is more elevated than the left eyebrow. The symptoms of somewhat worsened throughout the week although they are still mild. She was seen in the physician's office yesterday and diagnosed with Bell's palsy. She was prescribed Foltx and prednisone but has not taken them because she is breast-feeding. She has been also been having intermittent paresthesias and pain in her right upper extremity in about the C6 dermatome. Sensation is altered to touch in the areas of her face noted above but not in her right upper extremity. She has noticed no peripheral weakness.  Past Medical History  Diagnosis Date  . Abnormal Pap smear    Past Surgical History  Procedure Laterality Date  . Wisdom tooth extraction    . Dilation and curettage of uterus      11weeks demise  . Cesarean section  08/05/2011    Procedure: CESAREAN SECTION;  Surgeon: Esmeralda ArthurSandra A Rivard, MD;  Location: [facility];  Service: Gynecology;  Laterality: N/A;  Primary Cesarean Section with Delivery  Twins  Baby A Boy @ 1142, Baby B Girl @ 1144   No family history on file. History  Substance Use Topics  . Smoking status: Former Smoker -- 0.25 packs/day for 10 years    Types: Cigarettes  . Smokeless tobacco: Never Used  . Alcohol Use: No   OB History    Gravida Para Term Preterm AB TAB SAB Ectopic Multiple Living   14 4 1 3 10  0 10 0 2 6     Review of Systems  All other systems reviewed and are  negative.   Allergies  Penicillins; Sulfa antibiotics; and Latex  Home Medications   Prior to Admission medications   Not on File   BP 118/80 mmHg  Pulse 74  Temp(Src) 98.3 F (36.8 C) (Oral)  Resp 18  Ht 5' 8.5" (1.74 m)  Wt 161 lb (73.029 kg)  BMI 24.12 kg/m2  SpO2 97%   Physical Exam  General: Well-developed, well-nourished female in no acute distress; appearance consistent with age of record HENT: normocephalic; atraumatic Eyes: pupils equal, round and reactive to light; extraocular muscles intact Neck: supple Heart: regular rate and rhythm Lungs: clear to auscultation bilaterally Abdomen: soft; nondistended; nontender; bowel sounds present Extremities: No deformity; full range of motion; pulses normal Neurologic: Awake, alert and oriented; motor function intact in all extremities and symmetric; sensation intact in all extremities and symmetric; no facial droop but right eyebrow slightly higher than left eyebrow; subjectively altered sensation of lateral aspect of right periorbital and perioral regions; tongue midline; Skin: Warm and dry Psychiatric: Normal mood and affect    ED Course  Procedures (including critical care time)   MDM  5:27 AM The patient's symptoms and exam are not consistent with Bell's palsy. After discussion with Dr. Amada JupiterKirkpatrick of neurology we will transfer the [facility] by private [facility] [place]  for MRI of the brain with and without contrast to evaluate for multiple sclerosis or other central  pathology. Dr. Mirian Mo is the accepting EDP.       Paula Libra, MD 03/12/15 [phone]

## 2015-03-12 NOTE — ED Notes (Signed)
[facility], r/t whether or not patient had arrived.  Patient has not arrived.  Call placed to phone # listed on chart.  No answer and unable to leave message.

## 2015-03-12 NOTE — ED Notes (Signed)
Onset several days ago and progressing.  Noticed rt eyebrow raised,  Tingling of rt side of mouth  And temple area  And rt arm and thumb

## 2015-03-12 NOTE — Discharge Instructions (Signed)
[facility] directly [facility]. They should be expecting you.

## 2015-03-17 ENCOUNTER — Ambulatory Visit (HOSPITAL_COMMUNITY)
Admission: RE | Admit: 2015-03-17 | Discharge: 2015-03-17 | Disposition: A | Discharge status: Home / Self Care | Payer: Medicaid Other | Source: Ambulatory Visit | Attending: Emergency Medicine | Admitting: Emergency Medicine

## 2015-03-17 DIAGNOSIS — R200 Anesthesia of skin: Secondary | ICD-10-CM | POA: Insufficient documentation

## 2015-03-17 MED ORDER — GADOBENATE DIMEGLUMINE 529 MG/ML IV SOLN
15.0000 mL | Freq: Once | INTRAVENOUS | Status: AC | PRN
  Administered 2015-03-17: 14 mL via INTRAVENOUS

## 2017-10-20 LAB — OB RESULTS CONSOLE RUBELLA ANTIBODY, IGM: Rubella: IMMUNE

## 2017-10-20 LAB — OB RESULTS CONSOLE GC/CHLAMYDIA
CHLAMYDIA, DNA PROBE: NEGATIVE
GC PROBE AMP, GENITAL: NEGATIVE

## 2017-10-20 LAB — OB RESULTS CONSOLE VARICELLA ZOSTER ANTIBODY, IGG: Varicella: IMMUNE

## 2017-10-20 LAB — OB RESULTS CONSOLE HEPATITIS B SURFACE ANTIGEN: Hepatitis B Surface Ag: NEGATIVE

## 2017-11-15 NOTE — L&D Delivery Note (Signed)
Delivery Note At  a viable female was delivered via  (Presentation:vtx ;  ).  APGAR:8 , 9; weight  .   Placenta status:complete , .3V  Cord:  with the following complications: .  Cord pH: N/A  Anesthesia:  Nitrous Episiotomy:  N/A Lacerations:  None Suture Repair: N/a Est. Blood Loss (mL):  200cc  Mom to postpartum.  Baby to Couplet care / Skin to Skin.  Gwendolyn Glenn 04/18/2018, 7:20 AM

## 2018-02-13 LAB — OB RESULTS CONSOLE RPR
RPR: NONREACTIVE
RPR: NONREACTIVE

## 2018-02-13 LAB — OB RESULTS CONSOLE HIV ANTIBODY (ROUTINE TESTING)
HIV: NONREACTIVE
HIV: NONREACTIVE

## 2018-04-12 LAB — OB RESULTS CONSOLE GBS: GBS: NEGATIVE

## 2018-04-17 ENCOUNTER — Other Ambulatory Visit: Payer: Self-pay

## 2018-04-17 ENCOUNTER — Inpatient Hospital Stay (HOSPITAL_COMMUNITY)
Admission: AD | Admit: 2018-04-17 | Discharge: 2018-04-20 | DRG: 806 | Disposition: A | Discharge status: Home / Self Care | Payer: Medicaid Other | Source: Ambulatory Visit | Attending: Obstetrics & Gynecology | Admitting: Obstetrics & Gynecology

## 2018-04-17 ENCOUNTER — Encounter (HOSPITAL_COMMUNITY): Payer: Self-pay | Admitting: *Deleted

## 2018-04-17 DIAGNOSIS — Z91040 Latex allergy status: Secondary | ICD-10-CM | POA: Diagnosis not present

## 2018-04-17 DIAGNOSIS — D649 Anemia, unspecified: Secondary | ICD-10-CM | POA: Diagnosis present

## 2018-04-17 DIAGNOSIS — Z87891 Personal history of nicotine dependence: Secondary | ICD-10-CM

## 2018-04-17 DIAGNOSIS — O9902 Anemia complicating childbirth: Secondary | ICD-10-CM | POA: Diagnosis present

## 2018-04-17 DIAGNOSIS — O368130 Decreased fetal movements, third trimester, not applicable or unspecified: Principal | ICD-10-CM | POA: Diagnosis present

## 2018-04-17 DIAGNOSIS — Z880 Allergy status to penicillin: Secondary | ICD-10-CM | POA: Diagnosis not present

## 2018-04-17 DIAGNOSIS — O34219 Maternal care for unspecified type scar from previous cesarean delivery: Secondary | ICD-10-CM | POA: Diagnosis present

## 2018-04-17 DIAGNOSIS — O368190 Decreased fetal movements, unspecified trimester, not applicable or unspecified: Secondary | ICD-10-CM | POA: Diagnosis present

## 2018-04-17 DIAGNOSIS — Z3A37 37 weeks gestation of pregnancy: Secondary | ICD-10-CM

## 2018-04-17 DIAGNOSIS — O4103X0 Oligohydramnios, third trimester, not applicable or unspecified: Secondary | ICD-10-CM | POA: Diagnosis present

## 2018-04-17 DIAGNOSIS — O368131 Decreased fetal movements, third trimester, fetus 1: Secondary | ICD-10-CM

## 2018-04-17 LAB — COMPREHENSIVE METABOLIC PANEL
ALT: 10 U/L — ABNORMAL LOW (ref 14–54)
AST: 16 U/L (ref 15–41)
Albumin: 2.7 g/dL — ABNORMAL LOW (ref 3.5–5.0)
Alkaline Phosphatase: 105 U/L (ref 38–126)
Anion gap: 8 (ref 5–15)
BILIRUBIN TOTAL: 0.7 mg/dL (ref 0.3–1.2)
BUN: 5 mg/dL — AB (ref 6–20)
CHLORIDE: 107 mmol/L (ref 101–111)
CO2: 19 mmol/L — ABNORMAL LOW (ref 22–32)
Calcium: 8.3 mg/dL — ABNORMAL LOW (ref 8.9–10.3)
Creatinine, Ser: 0.36 mg/dL — ABNORMAL LOW (ref 0.44–1.00)
Glucose, Bld: 80 mg/dL (ref 65–99)
POTASSIUM: 3.9 mmol/L (ref 3.5–5.1)
Sodium: 134 mmol/L — ABNORMAL LOW (ref 135–145)
TOTAL PROTEIN: 5.6 g/dL — AB (ref 6.5–8.1)

## 2018-04-17 LAB — URINALYSIS, ROUTINE W REFLEX MICROSCOPIC
Bilirubin Urine: NEGATIVE
Glucose, UA: NEGATIVE mg/dL
Hgb urine dipstick: NEGATIVE
Ketones, ur: NEGATIVE mg/dL
Leukocytes, UA: NEGATIVE
Nitrite: NEGATIVE
PH: 7 (ref 5.0–8.0)
Protein, ur: NEGATIVE mg/dL
SPECIFIC GRAVITY, URINE: 1.017 (ref 1.005–1.030)

## 2018-04-17 LAB — CBC WITH DIFFERENTIAL/PLATELET
Basophils Absolute: 0 10*3/uL (ref 0.0–0.1)
Basophils Relative: 0 %
Eosinophils Absolute: 0.2 10*3/uL (ref 0.0–0.7)
Eosinophils Relative: 2 %
HCT: 33.6 % — ABNORMAL LOW (ref 36.0–46.0)
HEMOGLOBIN: 11.1 g/dL — AB (ref 12.0–15.0)
LYMPHS ABS: 1.5 10*3/uL (ref 0.7–4.0)
LYMPHS PCT: 15 %
MCH: 28.4 pg (ref 26.0–34.0)
MCHC: 33 g/dL (ref 30.0–36.0)
MCV: 85.9 fL (ref 78.0–100.0)
Monocytes Absolute: 0.5 10*3/uL (ref 0.1–1.0)
Monocytes Relative: 5 %
NEUTROS PCT: 78 %
Neutro Abs: 7.8 10*3/uL — ABNORMAL HIGH (ref 1.7–7.7)
Platelets: 223 10*3/uL (ref 150–400)
RBC: 3.91 MIL/uL (ref 3.87–5.11)
RDW: 14.5 % (ref 11.5–15.5)
WBC: 10 10*3/uL (ref 4.0–10.5)

## 2018-04-17 MED ORDER — LACTATED RINGERS IV SOLN: 500.0000 mL | INTRAVENOUS | Status: DC | PRN

## 2018-04-17 MED ORDER — TERBUTALINE SULFATE 1 MG/ML IJ SOLN: 0.2500 mg | Freq: Once | INTRAMUSCULAR | Status: DC | PRN

## 2018-04-17 MED ORDER — OXYTOCIN BOLUS FROM INFUSION
500.0000 mL | Freq: Once | INTRAVENOUS | Status: AC
  Administered 2018-04-18: 500 mL via INTRAVENOUS

## 2018-04-17 MED ORDER — OXYTOCIN 40 UNITS IN LACTATED RINGERS INFUSION - SIMPLE MED
1.0000 m[IU]/min | INTRAVENOUS | Status: DC
  Administered 2018-04-17: 1 m[IU]/min via INTRAVENOUS
  Filled 2018-04-17: qty 1000

## 2018-04-17 MED ORDER — LIDOCAINE HCL (PF) 1 % IJ SOLN: 30.0000 mL | INTRAMUSCULAR | Status: DC | PRN

## 2018-04-17 MED ORDER — ACETAMINOPHEN 325 MG PO TABS
650.0000 mg | ORAL_TABLET | ORAL | Status: DC | PRN
  Administered 2018-04-18: 650 mg via ORAL
  Filled 2018-04-17: qty 2

## 2018-04-17 MED ORDER — ONDANSETRON HCL 4 MG/2ML IJ SOLN: 4.0000 mg | Freq: Four times a day (QID) | INTRAMUSCULAR | Status: DC | PRN

## 2018-04-17 MED ORDER — OXYTOCIN 40 UNITS IN LACTATED RINGERS INFUSION - SIMPLE MED: 2.5000 [IU]/h | INTRAVENOUS | Status: DC

## 2018-04-17 MED ORDER — LACTATED RINGERS IV SOLN
INTRAVENOUS | Status: DC
  Administered 2018-04-17 – 2018-04-18 (×2): via INTRAVENOUS

## 2018-04-17 MED ORDER — SOD CITRATE-CITRIC ACID 500-334 MG/5ML PO SOLN: 30.0000 mL | ORAL | Status: DC | PRN

## 2018-04-17 NOTE — MAU Note (Signed)
Pt sent from [facility] with oligohydramnios, states she was sent for fluids, provider thinks she is dehydrated.  Decreased FM this week.  Denies contractions, bleeding or LOF.  Denies vomiting or diarrhea.

## 2018-04-17 NOTE — MAU Note (Signed)
Urine in lab 

## 2018-04-17 NOTE — Progress Notes (Signed)
Subjective: Pt still comfortable.  Daughter in room.  Objective: BP 110/72   Pulse 88   Temp 98.1 F (36.7 C) (Oral)   Resp 16   Ht 5\' 9"  (1.753 m)   Wt 85.7 kg (189 lb)   BMI 27.91 kg/m  No intake/output data recorded. No intake/output data recorded.  FHT: Category 1 FHT 135 accels noted, no decels UC:   regular, every 3-4 minutes SVE:   Dilation: 2 Effacement (%): 50 Station: -2 Exam by:: Bernerd PhoNancy Rejeana Fadness CNM Pitocin at 11 mu   Assessment:  G10 P1355 IUP at 37 weeks induction for oligo and decreased fetal movement Cat 1 strip Plan: Monitor progress Anticipate SVD Desires permanent sterilization.  Kenney HousemanNancy Jean Truth Wolaver CNM, MSN 04/17/2018, 10:58 PM

## 2018-04-17 NOTE — MAU Provider Note (Addendum)
History     CSN: [id number]  Arrival date and time: 04/17/18 1407   None     Chief Complaint  Patient presents with  . oligohydramnios   Gwendolyn Glenn, 36yo female, IUP, [id number], [redacted] weeks gestation presented with sent from [facility] for decreased fetal movement and oliohydramnios, AFI = 5.3, BPP 8/8, Dr Mora ApplPinn, send for for NST, evaluation and possible induction. Pt endorses feeling she is tired lately, and for 1 week has felt at least 10 kick a day but decreased from previous movements.  Pt endorses now +FM, no vaginal leakage, vaginal bleeding, or cramping. Pt denies back pain, dysuria, n, v, d, fever, cp or sob. Pt denies swelling, HA, RUQ pain.    Past Medical History:  Diagnosis Date  . Abnormal Pap smear     Past Surgical History:  Procedure Laterality Date  . CESAREAN SECTION  08/05/2011   Procedure: CESAREAN SECTION;  Surgeon: Esmeralda ArthurSandra A Rivard, MD;  Location: [facility];  Service: Gynecology;  Laterality: N/A;  Primary Cesarean Section with Delivery  Twins  Baby A Boy @ 1142, Baby B Girl @ 1144  . DILATION AND CURETTAGE OF UTERUS     11weeks demise  . WISDOM TOOTH EXTRACTION      No family history on file.  Social History   Tobacco Use  . Smoking status: Former Smoker    Packs/day: 0.25    Years: 10.00    Pack years: 2.50    Types: Cigarettes  . Smokeless tobacco: Never Used  Substance Use Topics  . Alcohol use: No  . Drug use: No    Allergies:  Allergies  Allergen Reactions  . Penicillins Hives    Has patient had a PCN reaction causing immediate rash, facial/tongue/throat swelling, SOB or lightheadedness with hypotension: Unknown Has patient had a PCN reaction causing severe rash involving mucus membranes or skin necrosis: No Has patient had a PCN reaction that required hospitalization: No Has patient had a PCN reaction occurring within the last 10 years: No If all of the above answers are "NO", then may proceed with Cephalosporin use.   . Sulfa Antibiotics Swelling     Childhood allergy  . Latex Rash    Medications Prior to Admission  Medication Sig Dispense Refill Last Dose  . flintstones complete (FLINTSTONES) 60 MG chewable tablet Chew 1 tablet by mouth daily.   04/16/2018 at Unknown time    Review of Systems  All other systems reviewed and are negative.  Physical Exam   Blood pressure 108/70, pulse 97, resp. rate 18, height 5\' 9"  (1.753 m), weight 85.7 kg (189 lb), unknown if currently breastfeeding.  Physical Exam  Constitutional: She appears well-developed and well-nourished.  HENT:  Head: Normocephalic and atraumatic.  Eyes: Pupils are equal, round, and reactive to light.  Neck: Normal range of motion. Neck supple.  Cardiovascular: Normal rate and regular rhythm.  Respiratory: Effort normal.  GI: Soft.  Genitourinary: Rectum normal, vagina normal and uterus normal. Pelvic exam was performed with patient prone. Cervix exhibits no motion tenderness.  Genitourinary Comments: SVE: 2cm/50/-3 CXT: none  Musculoskeletal: Normal range of motion.  Neurological: She is alert.  Skin: Skin is warm and dry.  Psychiatric: She has a normal mood and affect.    Most Recent FHR    Most Recent Value  Fetal Heart Rate A  Mode External filed at 04/17/2018 1714  Baseline Rate (A) 135 bpm filed at 04/17/2018 1714  Variability 6-25 BPM filed at 04/17/2018 1714  Accelerations  15 x 15 filed at 04/17/2018 1714  Decelerations None filed at 04/17/2018 1714   Results for orders placed or performed during the hospital encounter of 04/17/18 (from the past 24 hour(s))  Urinalysis, Routine w reflex microscopic     Status: Abnormal   Collection Time: 04/17/18  3:48 PM  Result Value Ref Range   Color, Urine YELLOW YELLOW   APPearance HAZY (A) CLEAR   Specific Gravity, Urine 1.017 1.005 - 1.030   pH 7.0 5.0 - 8.0   Glucose, UA NEGATIVE NEGATIVE mg/dL   Hgb urine dipstick NEGATIVE NEGATIVE   Bilirubin Urine NEGATIVE NEGATIVE   Ketones, ur NEGATIVE  NEGATIVE mg/dL   Protein, ur NEGATIVE NEGATIVE mg/dL   Nitrite NEGATIVE NEGATIVE   Leukocytes, UA NEGATIVE NEGATIVE    MAU Course  Procedures  MDM: SVE, NST, Admit for induction  Assessment and Plan  A: Gwendolyn Glenn, 36yo female, IUP, [id number], [redacted] weeks gestation presented with sent from [facility] for decreased fetal movement and oliohydramnios, AFI = 5.3, BPP 8/8, NSt reactive  P: Plan pt admit and begin oxcytocin per DR [facility] & Dr Su Hilt for induction.   Richards Pherigo NP-C 04/17/2018, 3:10 PM

## 2018-04-17 NOTE — H&P (Addendum)
Gwendolyn Glenn is a 37 y.o. female, [id number], IUP at 37 weeks, presenting for induction for decreased fetal movement and olgio.  Pt had been undergoing antepartum testing d/t AMA and h/o fetal demise (per Dr. Madalyn Glenn's note from last visit). She presented to the office today c/o decreased FM.  Pt endorses + FM now. Denies vaginal leakage. Denies vaginal bleeding. Denies feeling ctx's.   Pt was sent to MAU by Dr. Mora Glenn for decreased fetal movement and oliohydramnios, AFI = 5.3, BPP 8/8, Dr Gwendolyn Glenn, send for NS, evaluation and possible induction. Pt endorses feeling she is tired lately, and for 1 week has felt at least 10 kick a day but decreased from previous movements.  Pt endorses now +FM, no vaginal leakage, vaginal bleeding, or cramping. Pt denies back pain, dysuria, n, v, d, fever, cp or sob. Pt denies swelling, HA, RUQ pain.   Pt endorses she would like to be induced because she has 5 other children and lives 40 mins away.   SIDS: 2012, Twin A, approx 2 months after birth C-Section: malpresentation, IUGR had a VBAC since delivery RHd: Received Rhophylac at 30 weeks Preterm deliveries: 2003, 2006, 2012 Pyelonephritis: Hx 2014 pregnancy, sent into [facility]  Ultrasound done 04/12/2018 EFW 67% 3040 grams (6#11oz) AFI 11.15cm. Labor precautions discussed with patient. Antepartum fetal testing weekly d/t AMA and h/o fetal demise (per Dr. Madalyn Glenn's note from last visit).    Patient Active Problem List   Diagnosis Date Noted  . Rubella non-immune status, antepartum 06/04/2013  . VBAC, delivered 06/04/2013  . Grand multigravida 11/28/2012  . SIDS (sudden infant death syndrome) of daughter 12/01/2012  . Multiple gestations x 2 12/09/2012  . Pyelonephritis during 2003 pregnancy 12/15/2012  . Hx of preterm delivery, currently pregnant 12/06/2012  . Late prenatal care 11/18/2012  . FH: hemophilia 12/07/2012  . Allergy to penicillin 11/18/2012  . Pregnancy complicated by previous recurrent miscarriages  11/16/2012  . Pregnancy complicated by previous recurrent miscarriages 11/23/2012  . Previous cesarean delivery - consent vor TOLAC signed 12/26/12 11/21/2012  . Hx IUGR (intrauterine growth retardation) in 2012 pregnancy - rec u/s at 28-30wks 12/07/2012  . Latex allergy 12/10/2012  . Allergy to sulfa drugs 12/04/2012  . Rh negative status during pregnancy 11/29/2012    Past Medical History:  Diagnosis Date  . Abnormal Pap smear      No current facility-administered medications on file prior to encounter.    Current Outpatient Medications on File Prior to Encounter  Medication Sig Dispense Refill  . flintstones complete (FLINTSTONES) 60 MG chewable tablet Chew 1 tablet by mouth daily.       Allergies  Allergen Reactions  . Penicillins Hives    Has patient had a PCN reaction causing immediate rash, facial/tongue/throat swelling, SOB or lightheadedness with hypotension: Unknown Has patient had a PCN reaction causing severe rash involving mucus membranes or skin necrosis: No Has patient had a PCN reaction that required hospitalization: No Has patient had a PCN reaction occurring within the last 10 years: No If all of the above answers are "NO", then may proceed with Cephalosporin use.   . Sulfa Antibiotics Swelling    Childhood allergy  . Latex Rash    History of present pregnancy: Pt Info/Preference:  Screening/Consents:  Labs:   EDD: Estimated Date of Delivery: 05/08/18  Establised: No LMP recorded. Patient is pregnant.  Anatomy Scan: Date: around 20 weeks, normal Placenta Location: anterior Genetic Screen: Panoroma:?? AFP:  First Tri: Quad:  Office: [facility]  PNV: @ 34weeks Blood Type    Language: english Last PNV: today Rhogam    Flu Vaccine:  Given   Antibody    TDaP vaccine Given   GTT: Early: neg Third Trimester: neg  Feeding Plan: ?? BTL: 03/29/2018 Rubella:  neg  Contraception: BTL VBAC: 04/05/2018 RPR:   non-reactive  Circumcision: ??   HBsAg:  neg    Pediatrician:  ??   HIV:   neg  Prenatal Classes: NO Additional [place]: BBP 8/8 Today GBS:   neg      Chlamydia: neg      GC: neg  Support Person: 6 yr old daughter   PAP: normal  Pain Management: ??   Hgb Electrophoresis:  N/A  Birth Plan: ??   Hgb NOB: N/A    28W: 11.3   OB History    Gravida  10   Para  4   Term  1   Preterm  3   AB  5   Living  5     SAB  5   TAB  0   Ectopic  0   Multiple  2   Live Births  6          Past Medical History:  Diagnosis Date  . Abnormal Pap smear    Past Surgical History:  Procedure Laterality Date  . CESAREAN SECTION  08/05/2011   Procedure: CESAREAN SECTION;  Surgeon: Esmeralda Arthur, MD;  Location: [facility];  Service: Gynecology;  Laterality: N/A;  Primary Cesarean Section with Delivery  Twins  Baby A Boy @ 1142, Baby B Girl @ 1144  . DILATION AND CURETTAGE OF UTERUS     11weeks demise  . WISDOM TOOTH EXTRACTION     Family History: family history is not on file. Social History:  reports that she has quit smoking. Her smoking use included cigarettes. She has a 2.50 pack-year smoking history. She has never used smokeless tobacco. She reports that she does not drink alcohol or use drugs.   Prenatal Transfer Tool  Maternal Diabetes: No Genetic Screening: Normal Maternal Ultrasounds/Referrals: Normal Fetal Ultrasounds or other Referrals:  None Maternal Substance Abuse:  No Significant Maternal Medications:  None Significant Maternal Lab Results: None  ROS:  Review of Systems  Constitutional: Positive for malaise/fatigue.  All other systems reviewed and are negative.   Physical Exam: BP 108/70 (BP Location: Right Arm)   Pulse 97   Resp 18   Ht 5\' 9"  (1.753 m)   Wt 85.7 kg (189 lb)   BMI 27.91 kg/m   Physical Exam Physical Exam   Blood pressure 108/70, pulse 97, resp. rate 18, height 5\' 9"  (1.753 m), weight 85.7 kg (189 lb), unknown if currently breastfeeding.  Physical Exam  Constitutional: She appears  well-developed and well-nourished.  HENT:  Head: Normocephalic and atraumatic.  Eyes: Pupils are equal, round, and reactive to light.  Neck: Normal range of motion. Neck supple.  Cardiovascular: Normal rate and regular rhythm.  Respiratory: Effort normal.  GI: Soft.  Genitourinary: Rectum normal, vagina normal and uterus normal. Pelvic exam was performed with patient prone. Cervix exhibits no motion tenderness.  Genitourinary Comments: SVE: 2cm/50/-3 CXT: none  Musculoskeletal: Normal range of motion.  Neurological: She is alert.  Skin: Skin is warm and dry.  Psychiatric: She has a normal mood and affect.    Most Recent FHR    Most Recent Value  Fetal Heart Rate A  Mode External filed at 04/17/2018 1714  Baseline Rate (  A) 135 bpm filed at 04/17/2018 1714  Variability 6-25 BPM filed at 04/17/2018 1714  Accelerations 15 x 15 filed at 04/17/2018 1714  Decelerations None filed at 04/17/2018 1714   LabResultsLast24Hours     NST: FHR baseline 135 bpm, Variability: moderate, Accelerations:present, Decelerations:  Absent= Cat 1/Reactive UC:   none SVE:    , vertex verified by fetal sutures.  Leopold's: Position vertex, EFW 6.5 via leopold's.  Bishop score 5  In-Pt labs: Results for orders placed or performed during the hospital encounter of 04/17/18 (from the past 24 hour(s))  Urinalysis, Routine w reflex microscopic     Status: Abnormal   Collection Time: 04/17/18  3:48 PM  Result Value Ref Range   Color, Urine YELLOW YELLOW   APPearance HAZY (A) CLEAR   Specific Gravity, Urine 1.017 1.005 - 1.030   pH 7.0 5.0 - 8.0   Glucose, UA NEGATIVE NEGATIVE mg/dL   Hgb urine dipstick NEGATIVE NEGATIVE   Bilirubin Urine NEGATIVE NEGATIVE   Ketones, ur NEGATIVE NEGATIVE mg/dL   Protein, ur NEGATIVE NEGATIVE mg/dL   Nitrite NEGATIVE NEGATIVE   Leukocytes, UA NEGATIVE NEGATIVE      Assessment/Plan: Gwendolyn Glenn is a 36 y.o. female, [id number], IUP at 37 weeks, presenting  for induction for decreased fetal movement and olgio with h/o AMA and documentation of h/o fetal demise.  FWB: Cat 1 Fetal Tracing.   Plan: Admit to [facility] per consult with Dr Su Hilt.  VBAC Routine [facility] orders Pain med/epidural prn GBS Neg Anticipate labor progression with pitocin 1X2 protocol regimen.   Dr. Su Hilt aware of plan of care.   [place], JADE NP-C, MSN 04/17/2018, 5:29 PM

## 2018-04-17 NOTE — Plan of Care (Addendum)
Pt educated on labor and delivery process, pain management options discussed.  All questions answered.  Pt comfortable and resting, will continue to monitor and communicate with team as needed.  10455 - pt requesting pain relief "does not want to feel contractions".  RN explained that fentanyl will not take away all of the pain of contractions, but epidural could take away pain (not pressure). Pt requesting fentanyl again, will try nitrous oxide later when pt requests.  0601 - fentanyl administered again per pt request.  RN offered position changes to facilitate comfort and labor progression, pt declines.  Educated on benefits of changing positions in labor to facilitate dilation and fetal descent.    [id number] - pt requesting nitrous oxide.  Education on nitrous oxide provided; set up in room, see flowsheets.

## 2018-04-18 ENCOUNTER — Inpatient Hospital Stay (HOSPITAL_COMMUNITY): Payer: Medicaid Other | Admitting: Anesthesiology

## 2018-04-18 ENCOUNTER — Encounter (HOSPITAL_COMMUNITY): Payer: Self-pay | Admitting: *Deleted

## 2018-04-18 ENCOUNTER — Encounter (HOSPITAL_COMMUNITY)
Admission: AD | Disposition: A | Discharge status: Home / Self Care | Payer: Self-pay | Source: Ambulatory Visit | Attending: Obstetrics & Gynecology

## 2018-04-18 HISTORY — PX: TUBAL LIGATION: SHX77

## 2018-04-18 LAB — RPR: RPR Ser Ql: NONREACTIVE

## 2018-04-18 SURGERY — LIGATION, FALLOPIAN TUBE, POSTPARTUM
Anesthesia: General | Site: Abdomen | Laterality: Bilateral | Wound class: Clean Contaminated

## 2018-04-18 MED ORDER — FENTANYL CITRATE (PF) 250 MCG/5ML IJ SOLN
INTRAMUSCULAR | Status: AC
  Filled 2018-04-18: qty 5

## 2018-04-18 MED ORDER — MIDAZOLAM HCL 2 MG/2ML IJ SOLN
INTRAMUSCULAR | Status: AC
  Filled 2018-04-18: qty 2

## 2018-04-18 MED ORDER — BUPIVACAINE HCL (PF) 0.25 % IJ SOLN
INTRAMUSCULAR | Status: AC
  Filled 2018-04-18: qty 30

## 2018-04-18 MED ORDER — PROPOFOL 10 MG/ML IV BOLUS
INTRAVENOUS | Status: AC
  Filled 2018-04-18: qty 20

## 2018-04-18 MED ORDER — SENNOSIDES-DOCUSATE SODIUM 8.6-50 MG PO TABS
2.0000 | ORAL_TABLET | ORAL | Status: DC
  Administered 2018-04-18 – 2018-04-19 (×2): 2 via ORAL
  Filled 2018-04-18 (×2): qty 2

## 2018-04-18 MED ORDER — DEXAMETHASONE SODIUM PHOSPHATE 4 MG/ML IJ SOLN
INTRAMUSCULAR | Status: AC
  Filled 2018-04-18: qty 1

## 2018-04-18 MED ORDER — KETOROLAC TROMETHAMINE 30 MG/ML IJ SOLN
INTRAMUSCULAR | Status: AC
  Filled 2018-04-18: qty 1

## 2018-04-18 MED ORDER — SOD CITRATE-CITRIC ACID 500-334 MG/5ML PO SOLN
30.0000 mL | Freq: Once | ORAL | Status: AC
  Administered 2018-04-18: 30 mL via ORAL
  Filled 2018-04-18: qty 15

## 2018-04-18 MED ORDER — ONDANSETRON HCL 4 MG/2ML IJ SOLN: 4.0000 mg | INTRAMUSCULAR | Status: DC | PRN

## 2018-04-18 MED ORDER — COCONUT OIL OIL
1.0000 "application " | TOPICAL_OIL | Status: DC | PRN
  Administered 2018-04-18: 1 via TOPICAL
  Filled 2018-04-18 (×2): qty 120

## 2018-04-18 MED ORDER — ACETAMINOPHEN 325 MG PO TABS: 650.0000 mg | ORAL_TABLET | ORAL | Status: DC | PRN

## 2018-04-18 MED ORDER — IBUPROFEN 600 MG PO TABS
600.0000 mg | ORAL_TABLET | Freq: Four times a day (QID) | ORAL | Status: DC
  Administered 2018-04-18 – 2018-04-20 (×8): 600 mg via ORAL
  Filled 2018-04-18 (×8): qty 1

## 2018-04-18 MED ORDER — FENTANYL CITRATE (PF) 100 MCG/2ML IJ SOLN
100.0000 ug | INTRAMUSCULAR | Status: DC | PRN
  Administered 2018-04-18 (×3): 100 ug via INTRAVENOUS
  Filled 2018-04-18 (×3): qty 2

## 2018-04-18 MED ORDER — DIPHENHYDRAMINE HCL 25 MG PO CAPS: 25.0000 mg | ORAL_CAPSULE | Freq: Four times a day (QID) | ORAL | Status: DC | PRN

## 2018-04-18 MED ORDER — BENZOCAINE-MENTHOL 20-0.5 % EX AERO
1.0000 "application " | INHALATION_SPRAY | CUTANEOUS | Status: DC | PRN
  Administered 2018-04-18: 1 via TOPICAL
  Filled 2018-04-18 (×2): qty 56

## 2018-04-18 MED ORDER — ONDANSETRON HCL 4 MG PO TABS: 4.0000 mg | ORAL_TABLET | ORAL | Status: DC | PRN

## 2018-04-18 MED ORDER — LIDOCAINE HCL (CARDIAC) PF 100 MG/5ML IV SOSY
PREFILLED_SYRINGE | INTRAVENOUS | Status: AC
  Filled 2018-04-18: qty 5

## 2018-04-18 MED ORDER — ZOLPIDEM TARTRATE 5 MG PO TABS: 5.0000 mg | ORAL_TABLET | Freq: Every evening | ORAL | Status: DC | PRN

## 2018-04-18 MED ORDER — WITCH HAZEL-GLYCERIN EX PADS: 1.0000 "application " | MEDICATED_PAD | CUTANEOUS | Status: DC | PRN

## 2018-04-18 MED ORDER — ONDANSETRON HCL 4 MG/2ML IJ SOLN
INTRAMUSCULAR | Status: AC
  Filled 2018-04-18: qty 2

## 2018-04-18 MED ORDER — DIBUCAINE 1 % RE OINT
1.0000 "application " | TOPICAL_OINTMENT | RECTAL | Status: DC | PRN
  Filled 2018-04-18: qty 28

## 2018-04-18 MED ORDER — TETANUS-DIPHTH-ACELL PERTUSSIS 5-2.5-18.5 LF-MCG/0.5 IM SUSP
0.5000 mL | Freq: Once | INTRAMUSCULAR | Status: DC
  Filled 2018-04-18: qty 0.5

## 2018-04-18 MED ORDER — PRENATAL MULTIVITAMIN CH
1.0000 | ORAL_TABLET | Freq: Every day | ORAL | Status: DC
  Administered 2018-04-19 – 2018-04-20 (×2): 1 via ORAL
  Filled 2018-04-18 (×2): qty 1

## 2018-04-18 MED ORDER — SIMETHICONE 80 MG PO CHEW
80.0000 mg | CHEWABLE_TABLET | ORAL | Status: DC | PRN
Start: 2018-04-18 — End: 2018-04-20

## 2018-04-18 SURGICAL SUPPLY — 24 items
CLOTH BEACON ORANGE TIMEOUT ST (SAFETY) ×2 IMPLANT
DERMABOND ADVANCED (GAUZE/BANDAGES/DRESSINGS) ×1
DERMABOND ADVANCED .7 DNX12 (GAUZE/BANDAGES/DRESSINGS) ×1 IMPLANT
DRSG OPSITE POSTOP 3X4 (GAUZE/BANDAGES/DRESSINGS) ×2 IMPLANT
DURAPREP 26ML APPLICATOR (WOUND CARE) ×2 IMPLANT
ELECT REM PT RETURN 9FT ADLT (ELECTROSURGICAL) ×2
ELECTRODE REM PT RTRN 9FT ADLT (ELECTROSURGICAL) ×1 IMPLANT
GLOVE BIO SURGEON STRL SZ 6.5 (GLOVE) ×2 IMPLANT
GLOVE BIOGEL PI IND STRL 7.0 (GLOVE) ×3 IMPLANT
GLOVE BIOGEL PI INDICATOR 7.0 (GLOVE) ×3
GOWN STRL REUS W/TWL LRG LVL3 (GOWN DISPOSABLE) ×4 IMPLANT
NEEDLE HYPO 22GX1.5 SAFETY (NEEDLE) ×2 IMPLANT
NS IRRIG 1000ML POUR BTL (IV SOLUTION) ×2 IMPLANT
PACK ABDOMINAL MINOR (CUSTOM PROCEDURE TRAY) ×2 IMPLANT
PENCIL BUTTON HOLSTER BLD 10FT (ELECTRODE) ×2 IMPLANT
PROTECTOR NERVE ULNAR (MISCELLANEOUS) ×2 IMPLANT
SPONGE LAP 4X18 X RAY DECT (DISPOSABLE) ×2 IMPLANT
SUT MON AB 4-0 PS1 27 (SUTURE) ×2 IMPLANT
SUT PLAIN 0 NONE (SUTURE) IMPLANT
SUT VICRYL 0 UR6 27IN ABS (SUTURE) ×2 IMPLANT
SYR CONTROL 10ML LL (SYRINGE) ×2 IMPLANT
TOWEL OR 17X24 6PK STRL BLUE (TOWEL DISPOSABLE) ×4 IMPLANT
TRAY FOLEY CATH SILVER 14FR (SET/KITS/TRAYS/PACK) ×2 IMPLANT
WATER STERILE IRR 1000ML POUR (IV SOLUTION) ×2 IMPLANT

## 2018-04-18 NOTE — Progress Notes (Signed)
Patient to NICU to visit baby. Transported in wheelchair.

## 2018-04-18 NOTE — Transfer of Care (Signed)
Immediate Anesthesia Transfer of Care Note  Patient: Gwendolyn Glenn  Procedure(s) Performed:Cancelled, patient changed mind. No anesthesia given.  POST PARTUM TUBAL LIGATION (Bilateral Abdomen)  Patient Location: PACU and OB High Risk  Anesthesia Type:No anesthesia given because patient changed her mind and case was cancelled.  Level of Consciousness: awake, alert  and oriented  Airway & Oxygen Therapy: Patient Spontanous Breathing  Post-op Assessment: Report given to RN and Post -op Vital signs reviewed and stable  Post vital signs: Reviewed and stable  Last Vitals:  Vitals Value Taken Time  BP    Temp    Pulse    Resp    SpO2      Last Pain:  Vitals:   04/18/18 1323  TempSrc: Oral  PainSc:          Complications: No anesthesia given

## 2018-04-18 NOTE — Progress Notes (Signed)
Subjective: Starting to feel contractions.  Declines pain management  Objective: BP 118/73   Pulse 82   Temp 98.1 F (36.7 C) (Oral)   Resp 16   Ht 5\' 9"  (1.753 m)   Wt 85.7 kg (189 lb)   BMI 27.91 kg/m  No intake/output data recorded. No intake/output data recorded.  FHT: Category 1 UC:   regular, every 3-4 minutes SVE:   Dilation: 2 Effacement (%): 50 Station: -2 Exam by:: Bernerd PhoNancy Prothero CNM Pitocin at 19   Assessment:  G10 P1355 IUP at 37 weeks induction for oligo and decreased fetal movement Cat 1 strip  Plan: Anticipate SVD  Kenney HousemanNancy Jean Prothero CNM, MSN 04/18/2018, 12:54 AM

## 2018-04-18 NOTE — Progress Notes (Signed)
Patient has rested and is feeling good, she is leaving the floor to go to NICU.

## 2018-04-18 NOTE — Progress Notes (Signed)
PREOP NOTE  Subjective: Patient doing well s/p spontaneous vaginal delivery    Objective: I have reviewed patient's vital signs, medications and labs.  General: alert, cooperative and appears stated age Extremities: extremities normal, atraumatic, no cyanosis or edema and Homans sign is negative, no sign of DVT Vaginal Bleeding: minimal   Assessment/Plan: 37 yo s/p SVD this morning for post partum tubal ligation On call to OR General anesthesia as patient did not have epidural Type and cross match done on admission Discussed r/b/I with patient and she voiced understanding Consent signed, witnessed and placed into chart  LOS: 1 day    Gwendolyn Glenn [name] 04/18/2018, 11:45 AM

## 2018-04-18 NOTE — Anesthesia Preprocedure Evaluation (Signed)
Anesthesia Evaluation  Patient identified by MRN, date of birth, ID band Patient awake    Reviewed: Allergy & Precautions, NPO status , Patient's Chart, lab work & pertinent test results  Airway Mallampati: II  TM Distance: >3 FB Neck ROM: Full    Dental no notable dental hx. (+) Dental Advisory Given   Pulmonary former smoker,    Pulmonary exam normal breath sounds clear to auscultation       Cardiovascular negative cardio ROS Normal cardiovascular exam Rhythm:Regular Rate:Normal     Neuro/Psych negative neurological ROS     GI/Hepatic negative GI ROS, Neg liver ROS,   Endo/Other    Renal/GU negative Renal ROS     Musculoskeletal   Abdominal   Peds  Hematology negative hematology ROS (+)   Anesthesia Other Findings   Reproductive/Obstetrics                             Lab Results  Component Value Date   WBC 10.0 04/17/2018   HGB 11.1 (L) 04/17/2018   HCT 33.6 (L) 04/17/2018   MCV 85.9 04/17/2018   PLT 223 04/17/2018    Anesthesia Physical Anesthesia Plan  ASA: II  Anesthesia Plan: General   Post-op Pain Management:    Induction: Intravenous  PONV Risk Score and Plan: 3 and Treatment may vary due to age or medical condition and Ondansetron  Airway Management Planned:   Additional Equipment:   Intra-op Plan:   Post-operative Plan: Extubation in OR  Informed Consent: I have reviewed the patients History and Physical, chart, labs and discussed the procedure including the risks, benefits and alternatives for the proposed anesthesia with the patient or authorized representative who has indicated his/her understanding and acceptance.   Dental advisory given  Plan Discussed with: CRNA  Anesthesia Plan Comments:         Anesthesia Quick Evaluation

## 2018-04-18 NOTE — Op Note (Signed)
Patient panicked in the operating room and she decided she wanted to cancel procedure.  The patient was not given any anesthesia and no procedure was performed.   The procedure was aborted.   Gwendolyn Glenn

## 2018-04-19 LAB — CBC
HCT: 29.9 % — ABNORMAL LOW (ref 36.0–46.0)
HEMOGLOBIN: 10 g/dL — AB (ref 12.0–15.0)
MCH: 28.7 pg (ref 26.0–34.0)
MCHC: 33.4 g/dL (ref 30.0–36.0)
MCV: 85.7 fL (ref 78.0–100.0)
PLATELETS: 212 10*3/uL (ref 150–400)
RBC: 3.49 MIL/uL — AB (ref 3.87–5.11)
RDW: 14.5 % (ref 11.5–15.5)
WBC: 9.1 10*3/uL (ref 4.0–10.5)

## 2018-04-19 MED ORDER — FERROUS SULFATE 325 (65 FE) MG PO TABS
325.0000 mg | ORAL_TABLET | Freq: Two times a day (BID) | ORAL | Status: DC
  Administered 2018-04-20: 325 mg via ORAL

## 2018-04-19 MED ORDER — FERROUS SULFATE 325 (65 FE) MG PO TABS
325.0000 mg | ORAL_TABLET | Freq: Two times a day (BID) | ORAL | Status: DC
  Administered 2018-04-19 (×2): 325 mg via ORAL
  Filled 2018-04-19 (×3): qty 1

## 2018-04-19 NOTE — Progress Notes (Signed)
Subjective: Postpartum Day 1: Vaginal delivery, no laceration Patient up ad lib, reports no syncope or dizziness. Feeding:  Breast/PUMP Baby Girl: NICU being x for presumed pneumonia, tx with abx, +regulating temp, NG tube, start PO feeds.  Contraceptive plan: Nexplanon at 6 weeks PP visit/ Pt declined in pt depo due to past experience.   Pt found siting in bed with daughter at bedside in stable condition, pt denies any complaints. Taking motrin for cramps. Pumping for infant in NICU, ambulating, urinating, and passing gas. Pt tolerates po feeds. Pt endorses feeling great and ready for d/c when the baby goes home.   Objective: Vital signs in last 24 hours: Temp:  [97.6 F (36.4 C)-98.9 F (37.2 C)] 97.7 F (36.5 C) (06/05 0812) Pulse Rate:  [67-88] 88 (06/05 0812) Resp:  [16-20] 18 (06/05 0812) BP: (90-112)/(51-73) 107/73 (06/05 0812) SpO2:  [96 %-100 %] 98 % (06/05 [id number])  Physical Exam:  General: alert, cooperative, appears stated age and no distress Lochia: appropriate Uterine Fundus: firm Perineum: Intact DVT Evaluation: No evidence of DVT seen on physical exam. Negative Homan's sign. No cords or calf tenderness. No significant calf/ankle edema.   CBC Latest Ref Rng & Units 04/19/2018 04/17/2018 06/05/2013  WBC 4.0 - 10.5 K/uL 9.1 10.0 13.4(H)  Hemoglobin 12.0 - 15.0 g/dL 10.0(L) 11.1(L) 9.1(L)  Hematocrit 36.0 - 46.0 % 29.9(L) 33.6(L) 28.1(L)  Platelets 150 - 400 K/uL 212 223 198     Assessment/Plan: Status post vaginal delivery day 1. Stable Continue current care. Plan for discharge tomorrow, Breastfeeding, Lactation consult, Social Work consult and Contraception Nexplanon at 6 weeks PPV.  Anemia: started on iron.    Liyah Higham NP-C, CNM 04/19/2018, 10:27 AM

## 2018-04-20 MED ORDER — IBUPROFEN 600 MG PO TABS: 600.0000 mg | ORAL_TABLET | Freq: Four times a day (QID) | ORAL | 0 refills | Status: DC

## 2018-04-20 NOTE — Progress Notes (Signed)
Discharge teaching complete pt to nicu plan to  Be discharged  With infant  From NICU

## 2018-04-20 NOTE — Discharge Summary (Signed)
OB Discharge Summary     Patient Name: Gwendolyn BimlerDanielle D Glenn DOB: [birth year] MRN: [id number]  Date of admission: 04/17/2018 Delivering MD: Kenney HousemanPROTHERO, NANCY JEAN   Date of discharge: 04/20/2018  Admitting diagnosis: 37WKS, MONITORING Intrauterine pregnancy: 6729w1d     Secondary diagnosis:  Active Problems:   Decreased fetal movement   SVD (spontaneous vaginal delivery)  Additional problems: None     Discharge diagnosis: Anemia and Early term delivery                                                                                                Post partum procedures:Declined her BTL  Augmentation: AROM and Pitocin  Complications: None  Hospital course:  Induction of Labor With Vaginal Delivery   37 y.o. yo [id number] at 7029w1d was admitted to the hospital 04/17/2018 for induction of labor.  Indication for induction: AMA and Decreased fetal movement, olgiohydramnios.  Patient had an uncomplicated labor course as follows: Membrane Rupture Time/Date: 10:22 PM ,04/17/2018   Intrapartum Procedures: Episiotomy: None [1]                                         Lacerations:  None [1]  Patient had delivery of a Viable infant.  Information for the patient's newborn:  Gwendolyn Glenn, Girl Gwendolyn Glenn [[id number]][[id number]]  Delivery Method: VBAC, Spontaneous(Filed from Delivery Summary)   04/18/2018  Details of delivery can be found in separate delivery note.  Patient had a routine postpartum course. Patient is discharged home 04/20/18.  Physical exam  Vitals:   04/19/18 0442 04/19/18 0812 04/19/18 1649 04/19/18 1948  BP: (!) 90/51 107/73 112/74 119/86  Pulse: 68 88 66 77  Resp: 18 18 18    Temp: 98.2 F (36.8 C) 97.7 F (36.5 C) 97.6 F (36.4 C) 98.8 F (37.1 C)  TempSrc: Oral Oral  Oral  SpO2: 96% 98% 99% 100%  Weight:      Height:       General: alert, cooperative and no distress Lochia: appropriate Uterine Fundus: firm Vagina: Intact DVT Evaluation: No evidence of DVT seen on physical exam. Negative  Homan's sign. No cords or calf tenderness. No significant calf/ankle edema. Labs: Lab Results  Component Value Date   WBC 9.1 04/19/2018   HGB 10.0 (L) 04/19/2018   HCT 29.9 (L) 04/19/2018   MCV 85.7 04/19/2018   PLT 212 04/19/2018   CMP Latest Ref Rng & Units 04/17/2018  Glucose 65 - 99 mg/dL 80  BUN 6 - 20 mg/dL 5(L)  Creatinine 9.620.44 - 1.00 mg/dL 9.52(W0.36(L)  Sodium 413135 - 244145 mmol/L 134(L)  Potassium 3.5 - 5.1 mmol/L 3.9  Chloride 101 - 111 mmol/L 107  CO2 22 - 32 mmol/L 19(L)  Calcium 8.9 - 10.3 mg/dL 8.3(L)  Total Protein 6.5 - 8.1 g/dL 0.1(U5.6(L)  Total Bilirubin 0.3 - 1.2 mg/dL 0.7  Alkaline Phos 38 - 126 U/L 105  AST 15 - 41 U/L 16  ALT 14 - 54 U/L 10(L)    Discharge instruction: per After  Visit Summary and "Baby and Me Booklet".  After visit meds:  Allergies as of 04/20/2018      Reactions   Penicillins Hives   Has patient had a PCN reaction causing immediate rash, facial/tongue/throat swelling, SOB or lightheadedness with hypotension: Unknown Has patient had a PCN reaction causing severe rash involving mucus membranes or skin necrosis: No Has patient had a PCN reaction that required hospitalization: No Has patient had a PCN reaction occurring within the last 10 years: No If all of the above answers are "NO", then may proceed with Cephalosporin use.   Sulfa Antibiotics Swelling   Childhood allergy   Latex Rash      Medication List    TAKE these medications   flintstones complete 60 MG chewable tablet Chew 1 tablet by mouth daily.   ibuprofen 600 MG tablet Commonly known as:  ADVIL,MOTRIN Take 1 tablet (600 mg total) by mouth every 6 (six) hours.       Diet: routine diet  Activity: Advance as tolerated. Pelvic rest for 6 weeks.   Outpatient follow up:6 weeks Follow up Appt:No future appointments. Follow up Visit:No follow-ups on file.  Postpartum contraception: Nexplanon at 6 weeks PPV  Newborn Data: Live born female  Birth Weight: 6 lb 13 oz (3090  g) APGAR: 8, 9  Newborn Delivery   Birth date/time:  04/18/2018 07:06:00 Delivery type:  VBAC, Spontaneous     Baby Feeding: Breast Disposition:NICU Pt will room in near NICU.  Anemia: Iron  04/20/2018 Gwendolyn [place], FNP

## 2018-04-21 LAB — BPAM RBC
BLOOD PRODUCT EXPIRATION DATE: 201906302359
Blood Product Expiration Date: 201906202359
UNIT TYPE AND RH: 600
UNIT TYPE AND RH: 600

## 2018-04-21 LAB — TYPE AND SCREEN
ABO/RH(D): A NEG
Antibody Screen: POSITIVE
UNIT DIVISION: 0
Unit division: 0

## 2018-05-10 ENCOUNTER — Encounter (HOSPITAL_COMMUNITY): Payer: Self-pay | Admitting: Obstetrics & Gynecology

## 2018-06-24 ENCOUNTER — Encounter: Payer: Self-pay | Admitting: Emergency Medicine

## 2018-06-24 ENCOUNTER — Emergency Department: Payer: Medicaid Other

## 2018-06-24 ENCOUNTER — Other Ambulatory Visit: Payer: Self-pay

## 2018-06-24 ENCOUNTER — Inpatient Hospital Stay
Admission: EM | Admit: 2018-06-24 | Discharge: 2018-06-27 | DRG: 419 | Disposition: A | Discharge status: Home / Self Care | Payer: Medicaid Other | Attending: Surgery | Admitting: Surgery

## 2018-06-24 DIAGNOSIS — Z880 Allergy status to penicillin: Secondary | ICD-10-CM

## 2018-06-24 DIAGNOSIS — Z87891 Personal history of nicotine dependence: Secondary | ICD-10-CM | POA: Diagnosis not present

## 2018-06-24 DIAGNOSIS — Z9851 Tubal ligation status: Secondary | ICD-10-CM

## 2018-06-24 DIAGNOSIS — Z91040 Latex allergy status: Secondary | ICD-10-CM | POA: Diagnosis not present

## 2018-06-24 DIAGNOSIS — Z882 Allergy status to sulfonamides status: Secondary | ICD-10-CM | POA: Diagnosis not present

## 2018-06-24 DIAGNOSIS — K8012 Calculus of gallbladder with acute and chronic cholecystitis without obstruction: Secondary | ICD-10-CM | POA: Diagnosis not present

## 2018-06-24 DIAGNOSIS — R1011 Right upper quadrant pain: Secondary | ICD-10-CM

## 2018-06-24 DIAGNOSIS — K810 Acute cholecystitis: Secondary | ICD-10-CM | POA: Diagnosis present

## 2018-06-24 DIAGNOSIS — K8000 Calculus of gallbladder with acute cholecystitis without obstruction: Secondary | ICD-10-CM

## 2018-06-24 LAB — COMPREHENSIVE METABOLIC PANEL
ALK PHOS: 60 U/L (ref 38–126)
ALT: 30 U/L (ref 0–44)
ANION GAP: 7 (ref 5–15)
AST: 24 U/L (ref 15–41)
Albumin: 4.7 g/dL (ref 3.5–5.0)
BILIRUBIN TOTAL: 0.7 mg/dL (ref 0.3–1.2)
BUN: 10 mg/dL (ref 6–20)
CALCIUM: 8.9 mg/dL (ref 8.9–10.3)
CO2: 28 mmol/L (ref 22–32)
CREATININE: 0.79 mg/dL (ref 0.44–1.00)
Chloride: 106 mmol/L (ref 98–111)
GFR calc non Af Amer: >60 mL/min (ref >60)
GLUCOSE: 98 mg/dL (ref 70–99)
Potassium: 4.4 mmol/L (ref 3.5–5.1)
Sodium: 141 mmol/L (ref 135–145)
TOTAL PROTEIN: 7.5 g/dL (ref 6.5–8.1)

## 2018-06-24 LAB — CBC
HCT: 41.2 % (ref 35.0–47.0)
HEMOGLOBIN: 14.3 g/dL (ref 12.0–16.0)
MCH: 29 pg (ref 26.0–34.0)
MCHC: 34.7 g/dL (ref 32.0–36.0)
MCV: 83.5 fL (ref 80.0–100.0)
Platelets: 309 10*3/uL (ref 150–440)
RBC: 4.94 MIL/uL (ref 3.80–5.20)
RDW: 18 % — ABNORMAL HIGH (ref 11.5–14.5)
WBC: 9.8 10*3/uL (ref 3.6–11.0)

## 2018-06-24 LAB — URINALYSIS, COMPLETE (UACMP) WITH MICROSCOPIC
BACTERIA UA: NONE SEEN
Bilirubin Urine: NEGATIVE
Glucose, UA: NEGATIVE mg/dL
Hgb urine dipstick: NEGATIVE
Ketones, ur: 5 mg/dL — AB
Nitrite: NEGATIVE
PROTEIN: 30 mg/dL — AB
Specific Gravity, Urine: 1.026 (ref 1.005–1.030)
pH: 6 (ref 5.0–8.0)

## 2018-06-24 LAB — POCT PREGNANCY, URINE: PREG TEST UR: NEGATIVE

## 2018-06-24 LAB — LIPASE, BLOOD: Lipase: 27 U/L (ref 11–51)

## 2018-06-24 MED ORDER — ONDANSETRON 4 MG PO TBDP: 4.0000 mg | ORAL_TABLET | Freq: Four times a day (QID) | ORAL | Status: DC | PRN

## 2018-06-24 MED ORDER — DOCUSATE SODIUM 100 MG PO CAPS: 100.0000 mg | ORAL_CAPSULE | Freq: Two times a day (BID) | ORAL | Status: DC | PRN

## 2018-06-24 MED ORDER — ACETAMINOPHEN 10 MG/ML IV SOLN
1000.0000 mg | Freq: Once | INTRAVENOUS | Status: AC
  Administered 2018-06-24: 1000 mg via INTRAVENOUS
  Filled 2018-06-24: qty 100

## 2018-06-24 MED ORDER — PROMETHAZINE HCL 25 MG/ML IJ SOLN
12.5000 mg | Freq: Four times a day (QID) | INTRAMUSCULAR | Status: DC | PRN
  Administered 2018-06-24: 12.5 mg via INTRAVENOUS
  Filled 2018-06-24: qty 1

## 2018-06-24 MED ORDER — LEVOFLOXACIN IN D5W 500 MG/100ML IV SOLN
500.0000 mg | INTRAVENOUS | Status: DC
  Filled 2018-06-24: qty 100

## 2018-06-24 MED ORDER — LACTATED RINGERS IV SOLN
INTRAVENOUS | Status: DC
  Administered 2018-06-24 – 2018-06-25 (×3): via INTRAVENOUS

## 2018-06-24 MED ORDER — METRONIDAZOLE IN NACL 5-0.79 MG/ML-% IV SOLN
500.0000 mg | Freq: Once | INTRAVENOUS | Status: AC
  Administered 2018-06-24: 500 mg via INTRAVENOUS
  Filled 2018-06-24: qty 100

## 2018-06-24 MED ORDER — SODIUM CHLORIDE 0.9 % IV BOLUS
1000.0000 mL | Freq: Once | INTRAVENOUS | Status: AC
  Administered 2018-06-24: 1000 mL via INTRAVENOUS

## 2018-06-24 MED ORDER — METRONIDAZOLE IN NACL 5-0.79 MG/ML-% IV SOLN
500.0000 mg | Freq: Three times a day (TID) | INTRAVENOUS | Status: DC
  Administered 2018-06-25 (×2): 500 mg via INTRAVENOUS
  Filled 2018-06-24 (×5): qty 100

## 2018-06-24 MED ORDER — KETOROLAC TROMETHAMINE 30 MG/ML IJ SOLN
15.0000 mg | Freq: Once | INTRAMUSCULAR | Status: DC
  Filled 2018-06-24: qty 1

## 2018-06-24 MED ORDER — MORPHINE SULFATE (PF) 4 MG/ML IV SOLN
4.0000 mg | INTRAVENOUS | Status: DC | PRN
  Administered 2018-06-24 (×2): 4 mg via INTRAVENOUS
  Filled 2018-06-24 (×2): qty 1

## 2018-06-24 MED ORDER — TRAMADOL HCL 50 MG PO TABS
50.0000 mg | ORAL_TABLET | Freq: Four times a day (QID) | ORAL | Status: DC | PRN
  Administered 2018-06-25 – 2018-06-26 (×4): 50 mg via ORAL
  Filled 2018-06-24 (×4): qty 1

## 2018-06-24 MED ORDER — FENTANYL CITRATE (PF) 100 MCG/2ML IJ SOLN
100.0000 ug | INTRAMUSCULAR | Status: DC | PRN
  Administered 2018-06-24 – 2018-06-25 (×5): 100 ug via INTRAVENOUS
  Filled 2018-06-24 (×5): qty 2

## 2018-06-24 MED ORDER — MORPHINE SULFATE (PF) 2 MG/ML IV SOLN
2.0000 mg | INTRAVENOUS | Status: DC | PRN
  Administered 2018-06-24: 2 mg via INTRAVENOUS
  Filled 2018-06-24: qty 1

## 2018-06-24 MED ORDER — HYDROCODONE-ACETAMINOPHEN 5-325 MG PO TABS
1.0000 | ORAL_TABLET | ORAL | Status: DC | PRN
  Administered 2018-06-24: 2 via ORAL
  Filled 2018-06-24: qty 1
  Filled 2018-06-24: qty 2

## 2018-06-24 MED ORDER — LEVOFLOXACIN IN D5W 750 MG/150ML IV SOLN
750.0000 mg | Freq: Once | INTRAVENOUS | Status: AC
  Administered 2018-06-24: 750 mg via INTRAVENOUS
  Filled 2018-06-24: qty 150

## 2018-06-24 MED ORDER — ONDANSETRON HCL 4 MG/2ML IJ SOLN
4.0000 mg | Freq: Four times a day (QID) | INTRAMUSCULAR | Status: DC | PRN
  Administered 2018-06-25 – 2018-06-26 (×3): 4 mg via INTRAVENOUS
  Filled 2018-06-24 (×2): qty 2

## 2018-06-24 NOTE — ED Triage Notes (Signed)
Patient presents to the ED with pain in right upper quadrant radiating into her back.  Patient states she had her bile duct opened approx. 1 year ago and states she has had some issues with galbladder pain since then but much worse over the past week.  Patient states pain is worse with eating but now even if she doesn't eat she has pain.  Patient reports nausea and vomiting x 2 in the past 24 hours.  Patient denies diarrhea.

## 2018-06-24 NOTE — ED Provider Notes (Signed)
[facility] Emergency Department Provider Note    None    (approximate)  I have reviewed the triage vital signs and the nursing notes.   HISTORY  Chief Complaint Abdominal Pain    HPI Gwendolyn Glenn is a 37 y.o. female status post recent delivery presents with persistent recurrent right upper quadrant abdominal pain radiating through to her back and mid epigastric area.  Was initially intermittent and worse after eating over the past week but became constant and more severe today.  Denies any fevers.  Does have nausea and vomiting with this.  Is nonbilious nonbloody.  She still moving her bowels.  Denies any dysuria.  No flank pain.  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  Patient was to follow-up for scheduled elective cholecystectomy but never did this because she was pregnant.    Past Medical History:  Diagnosis Date  . Abnormal Pap smear    Family History  Problem Relation Age of Onset  . Hypertension Mother    Past Surgical History:  Procedure Laterality Date  . CESAREAN SECTION  08/05/2011   Procedure: CESAREAN SECTION;  Surgeon: Esmeralda ArthurSandra A Rivard, MD;  Location: [facility] OR[facility];  Service: Gynecology;  Laterality: N/A;  Primary Cesarean Section with Delivery  Twins  Baby A Boy @ 1142, Baby B Girl @ 1144  . DILATION AND CURETTAGE OF UTERUS     11weeks demise  . ESOPHAGOGASTRODUODENOSCOPY ENDOSCOPY    . TUBAL LIGATION Bilateral 04/18/2018   Procedure: POST PARTUM TUBAL LIGATION;  Surgeon: Essie HartPinn, Walda, MD;  [facility];  Service: Gynecology;  Laterality: Bilateral;  . WISDOM TOOTH EXTRACTION     Patient Active Problem List   Diagnosis Date Noted  . SVD (spontaneous vaginal delivery) 04/18/2018  . Decreased fetal movement 04/17/2018  . Rubella non-immune status, antepartum 06/04/2013  . VBAC, delivered 06/04/2013  . Grand multigravida  12/10/2012  . SIDS (sudden infant death syndrome) of daughter 12/10/2012  . Multiple gestations x 2 11/30/2012  . Pyelonephritis during 2003 pregnancy 11/17/2012  . Hx of preterm delivery, currently pregnant 12/09/2012  . Late prenatal care 12/05/2012  . FH: hemophilia 12/04/2012  . Allergy to penicillin 12/08/2012  . Pregnancy complicated by previous recurrent miscarriages 11/18/2012  . Pregnancy complicated by previous recurrent miscarriages 11/16/2012  . Previous cesarean delivery - consent vor TOLAC signed 12/26/12 12/08/2012  . Hx IUGR (intrauterine growth retardation) in 2012 pregnancy - rec u/s at 28-30wks 12/06/2012  . Latex allergy 12/02/2012  . Allergy to sulfa drugs 11/22/2012  . Rh negative status during pregnancy 11/29/2012      Prior to Admission medications   Medication Sig Start Date End Date Taking? Authorizing Provider  ibuprofen (ADVIL,MOTRIN) 600 MG tablet Take 1 tablet (600 mg total) by mouth every 6 (six) hours. Patient not taking: Reported on 06/24/2018 04/20/18   Dale DurhamMontana, Jade, FNP    Allergies Penicillins; Sulfa antibiotics; and Latex    Social History Social History   Tobacco Use  . Smoking status: Former Smoker    Packs/day: 0.25    Years: 10.00    Pack years: 2.50    Types: Cigarettes  . Smokeless tobacco: Never Used  Substance Use Topics  . Alcohol use: No  . Drug use: No    Review of Systems Patient denies headaches, rhinorrhea, blurry vision, numbness, shortness of breath, chest pain, edema, cough, abdominal pain, nausea, vomiting,  diarrhea, dysuria, fevers, rashes or hallucinations unless otherwise stated above in HPI. ____________________________________________   PHYSICAL EXAM:  VITAL SIGNS: Vitals:   06/24/18 1302 06/24/18 1601  BP: 129/90 (!) 147/97  Pulse: 75 (!) 52  Resp: 16 20  Temp: 98.6 F (37 C)   SpO2: 100% 100%    Constitutional: Alert and oriented.  Eyes: Conjunctivae are normal.  Head: Atraumatic. Nose: No  congestion/rhinnorhea. Mouth/Throat: Mucous membranes are moist.   Neck: No stridor. Painless ROM.  Cardiovascular: Normal rate, regular rhythm. Grossly normal heart sounds.  Good peripheral circulation. Respiratory: Normal respiratory effort.  No retractions. Lungs CTAB. Gastrointestinal: Soft with mild ruq ttpr. No distention. No abdominal bruits. No CVA tenderness. Genitourinary:  Musculoskeletal: No lower extremity tenderness nor edema.  No joint effusions. Neurologic:  Normal speech and language. No gross focal neurologic deficits are appreciated. No facial droop Skin:  Skin is warm, dry and intact. No rash noted. Psychiatric: Mood and affect are normal. Speech and behavior are normal.  ____________________________________________   LABS (all labs ordered are listed, but only abnormal results are displayed)  Results for orders placed or performed during the hospital encounter of 06/24/18 (from the past 24 hour(s))  Lipase, blood     Status: None   Collection Time: 06/24/18  1:07 PM  Result Value Ref Range   Lipase 27 11 - 51 U/L  Comprehensive metabolic panel     Status: None   Collection Time: 06/24/18  1:07 PM  Result Value Ref Range   Sodium 141 135 - 145 mmol/L   Potassium 4.4 3.5 - 5.1 mmol/L   Chloride 106 98 - 111 mmol/L   CO2 28 22 - 32 mmol/L   Glucose, Bld 98 70 - 99 mg/dL   BUN 10 6 - 20 mg/dL   Creatinine, Ser 1.61 0.44 - 1.00 mg/dL   Calcium 8.9 8.9 - 09.6 mg/dL   Total Protein 7.5 6.5 - 8.1 g/dL   Albumin 4.7 3.5 - 5.0 g/dL   AST 24 15 - 41 U/L   ALT 30 0 - 44 U/L   Alkaline Phosphatase 60 38 - 126 U/L   Total Bilirubin 0.7 0.3 - 1.2 mg/dL   GFR calc non Af Amer >60 >60 mL/min   GFR calc Af Amer >60 >60 mL/min   Anion gap 7 5 - 15  CBC     Status: Abnormal   Collection Time: 06/24/18  1:07 PM  Result Value Ref Range   WBC 9.8 3.6 - 11.0 K/uL   RBC 4.94 3.80 - 5.20 MIL/uL   Hemoglobin 14.3 12.0 - 16.0 g/dL   HCT 04.5 40.9 - 81.1 %   MCV 83.5 80.0 -  100.0 fL   MCH 29.0 26.0 - 34.0 pg   MCHC 34.7 32.0 - 36.0 g/dL   RDW 91.4 (H) 78.2 - 95.6 %   Platelets 309 150 - 440 K/uL  Urinalysis, Complete w Microscopic     Status: Abnormal   Collection Time: 06/24/18  1:07 PM  Result Value Ref Range   Color, Urine AMBER (A) YELLOW   APPearance CLOUDY (A) CLEAR   Specific Gravity, Urine 1.026 1.005 - 1.030   pH 6.0 5.0 - 8.0   Glucose, UA NEGATIVE NEGATIVE mg/dL   Hgb urine dipstick NEGATIVE NEGATIVE   Bilirubin Urine NEGATIVE NEGATIVE   Ketones, ur 5 (A) NEGATIVE mg/dL   Protein, ur 30 (A) NEGATIVE mg/dL   Nitrite NEGATIVE NEGATIVE   Leukocytes, UA MODERATE (A) NEGATIVE  RBC / HPF 0-5 0 - 5 RBC/hpf   WBC, UA 21-50 0 - 5 WBC/hpf   Bacteria, UA NONE SEEN NONE SEEN   Squamous Epithelial / LPF >50 (H) 0 - 5   Mucus PRESENT   Pregnancy, urine POC     Status: None   Collection Time: 06/24/18  1:12 PM  Result Value Ref Range   Preg Test, Ur NEGATIVE NEGATIVE   ____________________________________________ ____________________________________________  RADIOLOGY  I personally reviewed all radiographic images ordered to evaluate for the above acute complaints and reviewed radiology reports and findings.  These findings were personally discussed with the patient.  Please see medical record for radiology report.  ____________________________________________   PROCEDURES  Procedure(s) performed:  Procedures    Critical Care performed: no ____________________________________________   INITIAL IMPRESSION / ASSESSMENT AND PLAN / ED COURSE  Pertinent labs & imaging results that were available during my care of the patient were reviewed by me and considered in my medical decision making (see chart for details).   DDX: Cholelithiasis, biliary colic, cholecystitis, cholangitis, pancreatitis, enteritis  MARIONETTE MESKILL is a 37 y.o. who presents to the ED with right upper quadrant epigastric discomfort as described above.  Patient is AFVSS  in ED. Exam as above. Given current presentation have considered the above differential.  The patient will be placed on continuous pulse oximetry and telemetry for monitoring.  Laboratory evaluation will be sent to evaluate for the above complaints.    Will order right upper quadrant ultrasound to evaluate for cholelithiasis cholecystitis.   Clinical Course as of Jun 25 1707  Sat Jun 24, 2018  1612 I discussed case with Dr. Tonna Boehringer of general surgery regarding ultrasound findings concerning for acute cholecystitis.  Will give dose of antibiotics.  Patient does have penicillin allergy.  We will continue with IV resuscitation.  Dr. Tonna Boehringer is kindly agreed to evaluate patient at bedside.   [PR]  1632 Patient has been evaluated by general surgery at bedside with plan to go to the OR for cholecystectomy.  And patient updated and agrees with plan.   [PR]    Clinical Course User Index [PR] Willy Eddy, MD     As part of my medical decision making, I reviewed the following data within the electronic MEDICAL RECORD NUMBER Nursing notes reviewed and incorporated, Labs reviewed, notes from prior ED visits.   ____________________________________________   FINAL CLINICAL IMPRESSION(S) / ED DIAGNOSES  Final diagnoses:  RUQ abdominal pain  Acute cholecystitis due to biliary calculus      NEW MEDICATIONS STARTED DURING THIS VISIT:  New Prescriptions   No medications on file     Note:  This document was prepared using Dragon voice recognition software and may include unintentional dictation errors.    Willy Eddy, MD 06/24/18 [id number]

## 2018-06-24 NOTE — H&P (Signed)
Subjective:   CC: RUQ pain  HPI:  Gwendolyn Glenn is a 37 y.o. female who is consulted for evaluation of above cc.  Symptoms were first noted a few weeks ago. Pain is RUQ, dull, achy, worsening, radiating to right flank and occasionally across to epigastric area.  Associated with increasing nausea, exacerbated by food intake.  She reports GB issues in the past including ERCP and scheduled lap chole that was cancelled last min due to patient change of heart.     Past Medical History:  has a past medical history of Abnormal Pap smear.  Past Surgical History:  has a past surgical history that includes Wisdom tooth extraction; Dilation and curettage of uterus; Cesarean section (08/05/2011); Tubal ligation (Bilateral, 04/18/2018); and Esophagogastroduodenoscopy endoscopy.  Family History: family history includes Hypertension in her mother.  Social History:  reports that she has quit smoking. Her smoking use included cigarettes. She has a 2.50 pack-year smoking history. She has never used smokeless tobacco. She reports that she does not drink alcohol or use drugs.  Current Medications:  (Not in a hospital admission)  Allergies:  Allergies as of 06/24/2018 - Review Complete 06/24/2018  Allergen Reaction Noted  . Penicillins Hives 06/07/2011  . Sulfa antibiotics Swelling 06/07/2011  . Latex Rash 07/29/2011    ROS:  A 15 point review of systems was performed and pertinent positives and negatives noted in HPI    Objective:     BP (!) 147/97 (BP Location: Left Arm)   Pulse (!) 52   Temp 98.6 F (37 C) (Oral)   Resp 20   Ht 5\' 9"  (1.753 m)   Wt 77.1 kg   LMP 05/26/2018 (Approximate)   SpO2 100%   Breastfeeding? No   BMI 25.10 kg/m    Constitutional :  alert, cooperative, appears stated age and no distress  Lymphatics/Throat:  no asymmetry, masses, or scars  Respiratory:  clear to auscultation bilaterally  Cardiovascular:  regular rate and rhythm  Gastrointestinal: soft, but  tenderness in RUQ and along epigastric region.   Musculoskeletal: Steady gait and movement  Skin: Cool and moist  Psychiatric: Normal affect, non-agitated, not confused       LABS:  CMP Latest Ref Rng & Units 06/24/2018 04/17/2018  Glucose 70 - 99 mg/dL 98 80  BUN 6 - 20 mg/dL 10 5(L)  Creatinine 0.980.44 - 1.00 mg/dL 1.190.79 1.47(W0.36(L)  Sodium 295135 - 145 mmol/L 141 134(L)  Potassium 3.5 - 5.1 mmol/L 4.4 3.9  Chloride 98 - 111 mmol/L 106 107  CO2 22 - 32 mmol/L 28 19(L)  Calcium 8.9 - 10.3 mg/dL 8.9 6.2(Z8.3(L)  Total Protein 6.5 - 8.1 g/dL 7.5 3.0(Q5.6(L)  Total Bilirubin 0.3 - 1.2 mg/dL 0.7 0.7  Alkaline Phos 38 - 126 U/L 60 105  AST 15 - 41 U/L 24 16  ALT 0 - 44 U/L 30 10(L)   CBC Latest Ref Rng & Units 06/24/2018 04/19/2018 04/17/2018  WBC 3.6 - 11.0 K/uL 9.8 9.1 10.0  Hemoglobin 12.0 - 16.0 g/dL 65.714.3 10.0(L) 11.1(L)  Hematocrit 35.0 - 47.0 % 41.2 29.9(L) 33.6(L)  Platelets 150 - 440 K/uL 309 212 223     RADS: CLINICAL DATA:  Right upper quadrant abdominal pain for the past week.  EXAM: ULTRASOUND ABDOMEN LIMITED RIGHT UPPER QUADRANT  COMPARISON:  02/28/2017.  FINDINGS: Gallbladder:  Again demonstrated are multiple gallstones in the gallbladder, measuring up to 9 mm in maximum diameter each. Diffuse gallbladder wall thickening with a maximum thickness of 4.3  mm. No pericholecystic fluid. Positive sonographic Murphy sign.  Common bile duct:  Diameter: 6.4 mm.  Liver:  No focal lesion identified. Within normal limits in parenchymal echogenicity. Portal vein is patent on color Doppler imaging with normal direction of blood flow towards the liver.  IMPRESSION: 1. Cholelithiasis, diffuse gallbladder wall thickening and positive sonographic Murphy sign. This combination of findings is compatible with a diagnosis of acute cholecystitis. 2. Mildly dilated common duct. This could be an indication of a nonvisualized distal common duct stone.   Electronically Signed   By:  Beckie Salts M.D.   On: 06/24/2018 15:54 Assessment:      RUQ pain, previous and present history, PE, and [place] finding consistent for acute choelcystitis  Plan:      Discussed the risk of surgery including post-op infxn, seroma, biloma, chronic pain, poor-delayed wound healing, retained gallstone, conversion to open procedure, post-op SBO or ileus, and need for additional procedures to address said risks.  The risks of general anesthetic including MI, CVA, sudden death or even reaction to anesthetic medications also discussed. Alternatives include continued observation.  Benefits include possible symptom relief, prevention of complications including acute cholecystitis, pancreatitis.  Typical post operative recovery of 3-5 days rest, continued pain in area and incision sites, possible loose stools up to 4-6 weeks, also discussed.  The patient understands the risks, any and all questions were answered to the patient's satisfaction.  Admit: medsurg IVF: LR@75ml  after MN Abx: levaquin flagyl DVT prophy: lovenox(hold in am), SCDs GI prophy: none Diet: CLD until MN, then NPO sips with meds Pain control: prn I&O: qshift Activity: adlib Labs: daily am  Due to OR scheduling, will proceed with lap chole in am

## 2018-06-24 NOTE — ED Notes (Signed)
Josh, OR RN called and advised pt is not going to OR until tomorrow. Pt made aware.

## 2018-06-24 NOTE — Progress Notes (Signed)
Pharmacy Antibiotic Note  Gwendolyn BimlerDanielle D Brightbill is a 37 y.o. female admitted on 06/24/2018 with cholecystitis.  Pharmacy has been consulted for Levaquin and Flagyl dosing. Patient is scheduled for a cholecystectomy 06/25/18 am  Plan: Flagyl 500mg  IV every 8 hours Levaquin 500mg  IV every 24 hours  Height: 5\' 9"  (175.3 cm) Weight: 170 lb (77.1 kg) IBW/kg (Calculated) : 66.2  Temp (24hrs), Avg:98.6 F (37 C), Min:98.6 F (37 C), Max:98.6 F (37 C)  Recent Labs  Lab 06/24/18 1307  WBC 9.8  CREATININE 0.79    Estimated Creatinine Clearance: 102.6 mL/min (by C-G formula based on SCr of 0.79 mg/dL).    Allergies  Allergen Reactions  . Penicillins Hives    Has patient had a PCN reaction causing immediate rash, facial/tongue/throat swelling, SOB or lightheadedness with hypotension: Unknown Has patient had a PCN reaction causing severe rash involving mucus membranes or skin necrosis: No Has patient had a PCN reaction that required hospitalization: No Has patient had a PCN reaction occurring within the last 10 years: No If all of the above answers are "NO", then may proceed with Cephalosporin use.   . Sulfa Antibiotics Swelling    Childhood allergy  . Latex Rash    Antimicrobials this admission: Flagyl 8/10 >> Levaquin 8/10 >>   Thank you for allowing pharmacy to be a part of this patient's care.  Lowella Bandyodney D Riyanshi Wahab, PharmD 06/24/2018 7:14 PM

## 2018-06-25 ENCOUNTER — Inpatient Hospital Stay: Payer: Medicaid Other | Admitting: Anesthesiology

## 2018-06-25 ENCOUNTER — Ambulatory Visit: Payer: Self-pay | Admitting: Surgery

## 2018-06-25 ENCOUNTER — Other Ambulatory Visit: Payer: Self-pay

## 2018-06-25 ENCOUNTER — Encounter
Admission: EM | Disposition: A | Discharge status: Home / Self Care | Payer: Self-pay | Source: Home / Self Care | Attending: Surgery

## 2018-06-25 ENCOUNTER — Encounter: Payer: Self-pay | Admitting: Anesthesiology

## 2018-06-25 HISTORY — PX: CHOLECYSTECTOMY: SHX55

## 2018-06-25 LAB — BASIC METABOLIC PANEL
ANION GAP: 9 (ref 5–15)
BUN: 6 mg/dL (ref 6–20)
CALCIUM: 8.6 mg/dL — AB (ref 8.9–10.3)
CO2: 25 mmol/L (ref 22–32)
Chloride: 107 mmol/L (ref 98–111)
Creatinine, Ser: 0.66 mg/dL (ref 0.44–1.00)
GFR calc Af Amer: >60 mL/min (ref >60)
GLUCOSE: 105 mg/dL — AB (ref 70–99)
Potassium: 3.8 mmol/L (ref 3.5–5.1)
Sodium: 141 mmol/L (ref 135–145)

## 2018-06-25 LAB — SURGICAL PCR SCREEN
MRSA, PCR: NEGATIVE
STAPHYLOCOCCUS AUREUS: NEGATIVE

## 2018-06-25 LAB — HEPATIC FUNCTION PANEL
ALT: 25 U/L (ref 0–44)
AST: 20 U/L (ref 15–41)
Albumin: 4.2 g/dL (ref 3.5–5.0)
Alkaline Phosphatase: 55 U/L (ref 38–126)
BILIRUBIN DIRECT: 0.1 mg/dL (ref 0.0–0.2)
Indirect Bilirubin: 0.7 mg/dL (ref 0.3–0.9)
Total Bilirubin: 0.8 mg/dL (ref 0.3–1.2)
Total Protein: 7.2 g/dL (ref 6.5–8.1)

## 2018-06-25 LAB — MAGNESIUM: MAGNESIUM: 1.7 mg/dL (ref 1.7–2.4)

## 2018-06-25 LAB — CBC
HCT: 43.8 % (ref 35.0–47.0)
Hemoglobin: 14.6 g/dL (ref 12.0–16.0)
MCH: 27.9 pg (ref 26.0–34.0)
MCHC: 33.2 g/dL (ref 32.0–36.0)
MCV: 83.9 fL (ref 80.0–100.0)
Platelets: 270 10*3/uL (ref 150–440)
RBC: 5.23 MIL/uL — ABNORMAL HIGH (ref 3.80–5.20)
RDW: 18 % — AB (ref 11.5–14.5)
WBC: 16.3 10*3/uL — AB (ref 3.6–11.0)

## 2018-06-25 LAB — PHOSPHORUS: PHOSPHORUS: 3.2 mg/dL (ref 2.5–4.6)

## 2018-06-25 SURGERY — LAPAROSCOPIC CHOLECYSTECTOMY
Anesthesia: General

## 2018-06-25 MED ORDER — HYDROMORPHONE HCL 1 MG/ML IJ SOLN
0.5000 mg | INTRAMUSCULAR | Status: DC | PRN
  Administered 2018-06-25: 0.5 mg via INTRAVENOUS
  Filled 2018-06-25: qty 0.5

## 2018-06-25 MED ORDER — LEVOFLOXACIN IN D5W 750 MG/150ML IV SOLN
750.0000 mg | INTRAVENOUS | Status: DC
  Administered 2018-06-25: 750 mg via INTRAVENOUS
  Filled 2018-06-25 (×2): qty 150

## 2018-06-25 MED ORDER — HYDROMORPHONE HCL 1 MG/ML IJ SOLN
0.5000 mg | INTRAMUSCULAR | Status: DC | PRN
  Administered 2018-06-25 (×2): 0.5 mg via INTRAVENOUS
  Filled 2018-06-25 (×2): qty 1

## 2018-06-25 MED ORDER — FENTANYL CITRATE (PF) 100 MCG/2ML IJ SOLN
INTRAMUSCULAR | Status: DC | PRN
  Administered 2018-06-25: 100 ug via INTRAVENOUS
  Administered 2018-06-25 (×2): 50 ug via INTRAVENOUS

## 2018-06-25 MED ORDER — SODIUM CHLORIDE 0.45 % IV SOLN
INTRAVENOUS | Status: DC
  Administered 2018-06-25 – 2018-06-26 (×3): via INTRAVENOUS
  Filled 2018-06-25 (×5): qty 1000

## 2018-06-25 MED ORDER — MIDAZOLAM HCL 2 MG/2ML IJ SOLN
INTRAMUSCULAR | Status: AC
  Filled 2018-06-25: qty 2

## 2018-06-25 MED ORDER — LIDOCAINE HCL (PF) 2 % IJ SOLN
INTRAMUSCULAR | Status: AC
  Filled 2018-06-25: qty 10

## 2018-06-25 MED ORDER — SODIUM CHLORIDE FLUSH 0.9 % IV SOLN
INTRAVENOUS | Status: AC
  Filled 2018-06-25: qty 50

## 2018-06-25 MED ORDER — PROPOFOL 10 MG/ML IV BOLUS
INTRAVENOUS | Status: AC
  Filled 2018-06-25: qty 20

## 2018-06-25 MED ORDER — LIDOCAINE-EPINEPHRINE (PF) 1 %-1:200000 IJ SOLN
INTRAMUSCULAR | Status: DC | PRN
  Administered 2018-06-25: 15 mL

## 2018-06-25 MED ORDER — ROCURONIUM BROMIDE 50 MG/5ML IV SOLN
INTRAVENOUS | Status: AC
  Filled 2018-06-25: qty 1

## 2018-06-25 MED ORDER — DEXAMETHASONE SODIUM PHOSPHATE 10 MG/ML IJ SOLN
INTRAMUSCULAR | Status: DC | PRN
  Administered 2018-06-25: 8 mg via INTRAVENOUS

## 2018-06-25 MED ORDER — ROCURONIUM BROMIDE 100 MG/10ML IV SOLN
INTRAVENOUS | Status: DC | PRN
  Administered 2018-06-25: 10 mg via INTRAVENOUS
  Administered 2018-06-25: 30 mg via INTRAVENOUS
  Administered 2018-06-25 (×4): 10 mg via INTRAVENOUS

## 2018-06-25 MED ORDER — ONDANSETRON HCL 4 MG/2ML IJ SOLN: 4.0000 mg | Freq: Once | INTRAMUSCULAR | Status: DC | PRN

## 2018-06-25 MED ORDER — ACETAMINOPHEN 10 MG/ML IV SOLN
INTRAVENOUS | Status: DC | PRN
  Administered 2018-06-25: 1000 mg via INTRAVENOUS

## 2018-06-25 MED ORDER — DEXAMETHASONE SODIUM PHOSPHATE 10 MG/ML IJ SOLN
INTRAMUSCULAR | Status: AC
  Filled 2018-06-25: qty 1

## 2018-06-25 MED ORDER — ROCURONIUM BROMIDE 100 MG/10ML IV SOLN
INTRAVENOUS | Status: AC
  Filled 2018-06-25: qty 1

## 2018-06-25 MED ORDER — FENTANYL CITRATE (PF) 100 MCG/2ML IJ SOLN
INTRAMUSCULAR | Status: AC
  Filled 2018-06-25: qty 2

## 2018-06-25 MED ORDER — SUGAMMADEX SODIUM 500 MG/5ML IV SOLN
INTRAVENOUS | Status: DC | PRN
  Administered 2018-06-25: 200 mg via INTRAVENOUS

## 2018-06-25 MED ORDER — MUPIROCIN 2 % EX OINT
1.0000 "application " | TOPICAL_OINTMENT | Freq: Two times a day (BID) | CUTANEOUS | Status: DC
  Filled 2018-06-25: qty 22

## 2018-06-25 MED ORDER — LIDOCAINE HCL (CARDIAC) PF 100 MG/5ML IV SOSY
PREFILLED_SYRINGE | INTRAVENOUS | Status: DC | PRN
  Administered 2018-06-25: 40 mg via INTRAVENOUS

## 2018-06-25 MED ORDER — ESMOLOL HCL 100 MG/10ML IV SOLN
INTRAVENOUS | Status: DC | PRN
  Administered 2018-06-25: 10 mg via INTRAVENOUS

## 2018-06-25 MED ORDER — FENTANYL CITRATE (PF) 100 MCG/2ML IJ SOLN
25.0000 ug | INTRAMUSCULAR | Status: DC | PRN
  Administered 2018-06-25 (×4): 25 ug via INTRAVENOUS

## 2018-06-25 MED ORDER — ACETAMINOPHEN 10 MG/ML IV SOLN
INTRAVENOUS | Status: AC
  Filled 2018-06-25: qty 100

## 2018-06-25 MED ORDER — MORPHINE SULFATE (PF) 2 MG/ML IV SOLN
2.0000 mg | INTRAVENOUS | Status: DC | PRN
  Administered 2018-06-25 – 2018-06-26 (×5): 2 mg via INTRAVENOUS
  Filled 2018-06-25 (×5): qty 1

## 2018-06-25 MED ORDER — PROPOFOL 10 MG/ML IV BOLUS
INTRAVENOUS | Status: DC | PRN
  Administered 2018-06-25: 150 mg via INTRAVENOUS

## 2018-06-25 MED ORDER — PROMETHAZINE HCL 25 MG/ML IJ SOLN
12.5000 mg | Freq: Four times a day (QID) | INTRAMUSCULAR | Status: DC | PRN
  Administered 2018-06-25: 12.5 mg via INTRAVENOUS
  Filled 2018-06-25: qty 1

## 2018-06-25 MED ORDER — HYDROMORPHONE HCL 1 MG/ML IJ SOLN
0.5000 mg | INTRAMUSCULAR | Status: DC | PRN
  Filled 2018-06-25: qty 0.5

## 2018-06-25 MED ORDER — ONDANSETRON HCL 4 MG/2ML IJ SOLN
INTRAMUSCULAR | Status: AC
  Filled 2018-06-25: qty 2

## 2018-06-25 MED ORDER — BUPIVACAINE HCL (PF) 0.5 % IJ SOLN
INTRAMUSCULAR | Status: AC
Start: 2018-06-25 — End: ?
  Filled 2018-06-25: qty 30

## 2018-06-25 MED ORDER — SUCCINYLCHOLINE CHLORIDE 20 MG/ML IJ SOLN
INTRAMUSCULAR | Status: AC
  Filled 2018-06-25: qty 1

## 2018-06-25 MED ORDER — LIDOCAINE-EPINEPHRINE 1 %-1:100000 IJ SOLN
INTRAMUSCULAR | Status: AC
  Filled 2018-06-25: qty 3

## 2018-06-25 MED ORDER — ACETAMINOPHEN 10 MG/ML IV SOLN
1000.0000 mg | Freq: Once | INTRAVENOUS | Status: AC
  Administered 2018-06-25: 1000 mg via INTRAVENOUS
  Filled 2018-06-25: qty 100

## 2018-06-25 MED ORDER — MIDAZOLAM HCL 2 MG/2ML IJ SOLN
INTRAMUSCULAR | Status: DC | PRN
  Administered 2018-06-25 (×2): 1 mg via INTRAVENOUS

## 2018-06-25 MED ORDER — SUGAMMADEX SODIUM 200 MG/2ML IV SOLN
INTRAVENOUS | Status: AC
  Filled 2018-06-25: qty 2

## 2018-06-25 MED ORDER — KETOROLAC TROMETHAMINE 30 MG/ML IJ SOLN
INTRAMUSCULAR | Status: DC | PRN
  Administered 2018-06-25: 30 mg via INTRAVENOUS

## 2018-06-25 SURGICAL SUPPLY — 62 items
APPLICATOR COTTON TIP 6 STRL (MISCELLANEOUS) IMPLANT
APPLICATOR COTTON TIP 6IN STRL (MISCELLANEOUS)
APPLIER CLIP 5 13 M/L LIGAMAX5 (MISCELLANEOUS) ×2
BLADE SURG 11 STRL SS SAFETY (MISCELLANEOUS) ×2 IMPLANT
CANISTER SUCT 1200ML W/VALVE (MISCELLANEOUS) ×2 IMPLANT
CATH ROBINSON RED A/P 16FR (CATHETERS) ×2 IMPLANT
CHLORAPREP W/TINT 26ML (MISCELLANEOUS) ×2 IMPLANT
CHOLANGIOGRAM CATH TAUT (CATHETERS) IMPLANT
CLIP APPLIE 5 13 M/L LIGAMAX5 (MISCELLANEOUS) ×1 IMPLANT
CNTNR SPEC 2.5X3XGRAD LEK (MISCELLANEOUS) ×1
CONT SPEC 4OZ STER OR WHT (MISCELLANEOUS) ×1
CONTAINER SPEC 2.5X3XGRAD LEK (MISCELLANEOUS) ×1 IMPLANT
CUTTER FLEX LINEAR 45M (STAPLE) ×2 IMPLANT
DECANTER SPIKE VIAL GLASS SM (MISCELLANEOUS) ×4 IMPLANT
DEFOGGER SCOPE WARMER CLEARIFY (MISCELLANEOUS) IMPLANT
DERMABOND ADVANCED (GAUZE/BANDAGES/DRESSINGS) ×1
DERMABOND ADVANCED .7 DNX12 (GAUZE/BANDAGES/DRESSINGS) ×1 IMPLANT
DISSECTOR BLUNT TIP ENDO 5MM (MISCELLANEOUS) IMPLANT
DISSECTOR KITTNER STICK (MISCELLANEOUS) ×1 IMPLANT
DISSECTORS/KITTNER STICK (MISCELLANEOUS) ×2
DRAPE C-ARM XRAY 36X54 (DRAPES) IMPLANT
DRAPE SHEET LG 3/4 BI-LAMINATE (DRAPES) IMPLANT
ELECT CAUTERY BLADE 6.4 (BLADE) IMPLANT
ELECT REM PT RETURN 9FT ADLT (ELECTROSURGICAL) ×2
ELECTRODE REM PT RTRN 9FT ADLT (ELECTROSURGICAL) ×1 IMPLANT
GLOVE BIOGEL PI IND STRL 7.0 (GLOVE) ×1 IMPLANT
GLOVE BIOGEL PI INDICATOR 7.0 (GLOVE) ×1
GLOVE SURG SYN 6.5 ES PF (GLOVE) ×2 IMPLANT
GOWN STRL REUS W/ TWL LRG LVL3 (GOWN DISPOSABLE) ×3 IMPLANT
GOWN STRL REUS W/TWL LRG LVL3 (GOWN DISPOSABLE) ×3
GRADUATE 1200CC STRL 31836 (MISCELLANEOUS) ×2 IMPLANT
GRASPER SUT TROCAR 14GX15 (MISCELLANEOUS) ×2 IMPLANT
IRRIGATION STRYKERFLOW (MISCELLANEOUS) IMPLANT
IRRIGATOR STRYKERFLOW (MISCELLANEOUS)
IV CATH ANGIO 12GX3 LT BLUE (NEEDLE) IMPLANT
IV NS 1000ML (IV SOLUTION) ×1
IV NS 1000ML BAXH (IV SOLUTION) ×1 IMPLANT
JACKSON PRATT 10 (INSTRUMENTS) IMPLANT
L-HOOK LAP DISP 36CM (ELECTROSURGICAL) ×2
LHOOK LAP DISP 36CM (ELECTROSURGICAL) ×1 IMPLANT
NEEDLE HYPO 22GX1.5 SAFETY (NEEDLE) ×2 IMPLANT
PACK LAP CHOLECYSTECTOMY (MISCELLANEOUS) ×2 IMPLANT
PENCIL ELECTRO HAND CTR (MISCELLANEOUS) ×2 IMPLANT
POUCH SPECIMEN RETRIEVAL 10MM (ENDOMECHANICALS) ×2 IMPLANT
RELOAD STAPLE TA45 3.5 REG BLU (ENDOMECHANICALS) ×2 IMPLANT
SCISSORS METZENBAUM CVD 33 (INSTRUMENTS) ×2 IMPLANT
SLEEVE ENDOPATH XCEL 5M (ENDOMECHANICALS) ×4 IMPLANT
SPONGE LAP 18X18 RF (DISPOSABLE) IMPLANT
SPONGE XRAY 4X4 16PLY STRL (MISCELLANEOUS) ×2 IMPLANT
STOPCOCK 4 WAY LG BORE MALE ST (IV SETS) IMPLANT
SUT MNCRL 4-0 (SUTURE) ×1
SUT MNCRL 4-0 27XMFL (SUTURE) ×1
SUT VIC AB 3-0 SH 27 (SUTURE)
SUT VIC AB 3-0 SH 27X BRD (SUTURE) IMPLANT
SUT VICRYL 0 AB UR-6 (SUTURE) ×4 IMPLANT
SUTURE MNCRL 4-0 27XMF (SUTURE) ×1 IMPLANT
SYR 20CC LL (SYRINGE) ×2 IMPLANT
TOWEL OR 17X26 4PK STRL BLUE (TOWEL DISPOSABLE) ×4 IMPLANT
TROCAR XCEL BLUNT TIP 100MML (ENDOMECHANICALS) ×2 IMPLANT
TROCAR XCEL NON-BLD 5MMX100MML (ENDOMECHANICALS) ×2 IMPLANT
TUBING INSUFFLATION (TUBING) ×2 IMPLANT
WATER STERILE IRR 1000ML POUR (IV SOLUTION) ×2 IMPLANT

## 2018-06-25 NOTE — Progress Notes (Signed)
Dr. Tonna BoehringerSakai cellphone: [phone]: [phone] called for IVF order for postop; patient NPO w/o fluids ordered; awaiting callback. Windy Carinaurner,Lauramae Kneisley K, RN8:19 PM 06/25/2018

## 2018-06-25 NOTE — Transfer of Care (Signed)
Immediate Anesthesia Transfer of Care Note  Patient: Gwendolyn Glenn  Procedure(s) Performed: LAPAROSCOPIC CHOLECYSTECTOMY (N/A )  Patient Location: PACU  Anesthesia Type:General  Level of Consciousness: sedated  Airway & Oxygen Therapy: Patient Spontanous Breathing and Patient connected to face mask oxygen  Post-op Assessment: Report given to RN and Post -op Vital signs reviewed and stable  Post vital signs: Reviewed and stable  Last Vitals:  Vitals Value Taken Time  BP 111/67 06/25/2018  6:16 PM  Temp    Pulse 102 06/25/2018  6:18 PM  Resp 22 06/25/2018  6:18 PM  SpO2 100 % 06/25/2018  6:18 PM  Vitals shown include unvalidated device data.  Last Pain:  Vitals:   06/25/18 1220  TempSrc: Oral  PainSc:          Complications: No apparent anesthesia complications

## 2018-06-25 NOTE — Anesthesia Post-op Follow-up Note (Signed)
Anesthesia QCDR form completed.        

## 2018-06-25 NOTE — Progress Notes (Signed)
Dr. Tonna BoehringerSakai notified of no maintanence fluids; acknowledged; new order written.  Waiting for maint. Fluids to come from pharmacy. Windy Carinaurner,Camren Henthorn K, RN 8:28 PM 06/25/2018

## 2018-06-25 NOTE — Anesthesia Postprocedure Evaluation (Signed)
Anesthesia Post Note  Patient: Gwendolyn Glenn  Procedure(Glenn) Performed: LAPAROSCOPIC CHOLECYSTECTOMY (N/A )  Patient location during evaluation: PACU Anesthesia Type: General Level of consciousness: awake and alert Pain management: pain level controlled Vital Signs Assessment: post-procedure vital signs reviewed and stable Respiratory status: spontaneous breathing, nonlabored ventilation, respiratory function stable and patient connected to nasal cannula oxygen Cardiovascular status: blood pressure returned to baseline and stable Postop Assessment: no apparent nausea or vomiting Anesthetic complications: no     Last Vitals:  Vitals:   06/25/18 1952 06/25/18 2041  BP: 110/70 109/72  Pulse: 88 84  Resp: 20 16  Temp: 36.7 C 36.8 C  SpO2: 95% 97%    Last Pain:  Vitals:   06/25/18 2145  TempSrc:   PainSc: 5                  Gwendolyn Glenn

## 2018-06-25 NOTE — Op Note (Signed)
Preoperative diagnosis:  acute and cholecystitis  Postoperative diagnosis: [id number]  Procedure: Laparoscopic Cholecystectomy.   Anesthesia: GETA   Surgeon: Sung Amabile  Specimen: Gallbladder  Complications: None  EBL: 15mL  Wound Classification: Clean Contaminated  Indications: see HPI  Findings: See below  Description of procedure: The patient was placed on the operating table in the supine position. SCDs placed, pre-op abx administered.  General anesthesia was induced and OG tube placed by anesthesia. A time-out was completed verifying correct patient, procedure, site, positioning, and implant(s) and/or special equipment prior to beginning this procedure. The abdomen was prepped and draped in the usual sterile fashion.  An incision was made in a natural skin line underneath the umbilicus.  Dissection carried down to fascia where two 0 vicryl sutures placed to use as anchor sutures for hasson port.  Incision made into fascia and blunt dissection used to enter peritoneum.  Hasson port placed and insufflation started up to 15mm Hg without any dramatic increase in pressure.    The laparoscope was inserted and the abdomen inspected. No injuries from initial trocar placement were noted. Additional trocars were then inserted under direct visualization in the following locations: a 5-mm trocar in the subxyphoid region and two 5-mm trocars along the right costal margin. The abdomen was inspected and no abnormalities or injuries were found. The table was placed in the reverse Trendelenburg position with the right side up.  Filmy adhesions between the gallbladder and omentum, duodenum and transverse colon were lysed sharply. Attempt made to grasp dome of the gallbladder  with an atraumatic grasper but GB too tense.  Needle decompression performed and 3ml of thick purulent material aspirated.  Second attemp t at retraction successful and GB retracted over the dome of the liver. The infundibulum  was also grasped with an atraumatic grasper and retracted toward the right lower quadrant. Extensive dissection carried out to obtain critical view but do to extremely thick and dense peritoneal lining distorting anatomy, critical view unable to be obtained.  Decision made at this point to start a top down dissection.  A plane was dissected between the GB dome and pertioneum, and dissection was carried down the posterior GB wall until the majority of the GB was off the liver bed and able to be retracted away from the portal structures.  Once this was achieved, carefull dissection at the base of the gallbladder was once again attempted, at which point the pulsatile cystic artery that was clearly reaching away from the liver into the GB was noted.  This was clipped and ligated.  Isolating the cystic duct attempted again but unsuccessful.  Since the infundibulum was clearly retracted away from the portal structures at this point, a endoGIA linear stapler was used to transect the base of the GB. Staples intact, no leak noted.  The gallbladder was removed using an endoscopic retrieval bag placed through the umbilical port. The gallbladder was passed off the table as a specimen. The gallbladder fossa was copiously irrigated with saline and any leaked bile was suctioned out, and hemostasis was obtained. There was no evidence of bleeding from the gallbladder fossa, cystic artery stump, or the staple line. Abdomen desufflated and secondary trocars were removed under direct vision. No bleeding was noted. The laparoscope was withdrawn and the umbilical trocar removed.  The fascia of the Hasson trocar site was closed with figure-of-eight 0 vicryl sutures.  All skin incisions  were closed with subcuticular sutures of 4-0 monocryl and dressed with topical skin adhesive. The  orogastric tube was removed and patient extubated.  Straight cat performed prior to extubation to decompress her bladder for the long procedure. The  patient tolerated the procedure well and was taken to the postanesthesia care unit in stable condition.  All sponge and instrument count correct at end of procedure.

## 2018-06-25 NOTE — Progress Notes (Signed)
Pharmacy Antibiotic Note  Gwendolyn Glenn is a 37 y.o. female admitted on 06/24/2018 with cholecystitis.  Pharmacy has been consulted for Levaquin and Flagyl dosing. Patient is scheduled for a cholecystectomy 06/25/18 am  Plan: Flagyl 500mg  IV every 8 hours  Levaquin 500mg  IV every 24 hours. Based on IAI indication will increase dose to 750 mg IV q24h.   Height: 5\' 9"  (175.3 cm) Weight: 174 lb 1.6 oz (79 kg) IBW/kg (Calculated) : 66.2  Temp (24hrs), Avg:98.4 F (36.9 C), Min:97.5 F (36.4 C), Max:99.1 F (37.3 C)  Recent Labs  Lab 06/24/18 1307 06/25/18 0408  WBC 9.8 16.3*  CREATININE 0.79 0.66    Estimated Creatinine Clearance: 102.6 mL/min (by C-G formula based on SCr of 0.66 mg/dL).    Allergies  Allergen Reactions  . Penicillins Hives    Has patient had a PCN reaction causing immediate rash, facial/tongue/throat swelling, SOB or lightheadedness with hypotension: Unknown Has patient had a PCN reaction causing severe rash involving mucus membranes or skin necrosis: No Has patient had a PCN reaction that required hospitalization: No Has patient had a PCN reaction occurring within the last 10 years: No If all of the above answers are "NO", then may proceed with Cephalosporin use.   . Sulfa Antibiotics Swelling    Childhood allergy  . Latex Rash    Antimicrobials this admission: Flagyl 8/10 >> Levaquin 8/10 >>   Thank you for allowing pharmacy to be a part of this patient's care.  Marty HeckWang, Antione Obar L, PharmD 06/25/2018 4:03 PM

## 2018-06-25 NOTE — Progress Notes (Signed)
Pt complaining of pain throughout night along with Nausea. Primary nurse called and spoke to Dr. Tonna BoehringerSakai. Orders received for 0.5 Mg dilaudid q 4 hr prn along with phenergan 12.5 mg q 6 hr prn. Primary nurse to continue to monitor.

## 2018-06-25 NOTE — Interval H&P Note (Signed)
History and Physical Interval Note:  06/25/2018 1:08 PM  Alvy Bimleranielle D Malkin  has presented today for surgery, with the diagnosis of cholecystitis  The various methods of treatment have been discussed with the patient and family. After consideration of risks, benefits and other options for treatment, the patient has consented to  Procedure(s): LAPAROSCOPIC CHOLECYSTECTOMY (N/A) as a surgical intervention .  The patient's history has been reviewed, patient examined, no change in status, stable for surgery.  I have reviewed the patient's chart and labs.  Questions were answered to the patient's satisfaction.     Carsen Leaf Tonna BoehringerSakai

## 2018-06-25 NOTE — Anesthesia Preprocedure Evaluation (Signed)
Anesthesia Evaluation  Patient identified by MRN, date of birth, ID band Patient awake    Reviewed: Allergy & Precautions, NPO status , Patient's Chart, lab work & pertinent test results, reviewed documented beta blocker date and time   Airway Mallampati: II  TM Distance: >3 FB     Dental  (+) Chipped   Pulmonary former smoker,           Cardiovascular      Neuro/Psych    GI/Hepatic   Endo/Other    Renal/GU      Musculoskeletal   Abdominal   Peds  Hematology   Anesthesia Other Findings   Reproductive/Obstetrics                             Anesthesia Physical Anesthesia Plan  ASA: II  Anesthesia Plan: General   Post-op Pain Management:    Induction: Intravenous  PONV Risk Score and Plan:   Airway Management Planned: Oral ETT  Additional Equipment:   Intra-op Plan:   Post-operative Plan:   Informed Consent: I have reviewed the patients History and Physical, chart, labs and discussed the procedure including the risks, benefits and alternatives for the proposed anesthesia with the patient or authorized representative who has indicated his/her understanding and acceptance.     Plan Discussed with: CRNA  Anesthesia Plan Comments:         Anesthesia Quick Evaluation  

## 2018-06-25 NOTE — Anesthesia Procedure Notes (Signed)
Procedure Name: Intubation Date/Time: 06/25/2018 2:05 PM Performed by: Allean Found, CRNA Pre-anesthesia Checklist: Patient identified, Emergency Drugs available, Suction available, Patient being monitored and Timeout performed Patient Re-evaluated:Patient Re-evaluated prior to induction Oxygen Delivery Method: Circle system utilized Preoxygenation: Pre-oxygenation with 100% oxygen Induction Type: IV induction Ventilation: Mask ventilation without difficulty Laryngoscope Size: Mac and 3 Grade View: Grade I Tube type: Oral Tube size: 7.0 mm Number of attempts: 1 Airway Equipment and Method: Stylet Placement Confirmation: ETT inserted through vocal cords under direct vision,  positive ETCO2 and breath sounds checked- equal and bilateral Secured at: 21 cm Tube secured with: Tape Dental Injury: Teeth and Oropharynx as per pre-operative assessment

## 2018-06-26 ENCOUNTER — Encounter: Payer: Self-pay | Admitting: Surgery

## 2018-06-26 ENCOUNTER — Inpatient Hospital Stay: Payer: Medicaid Other

## 2018-06-26 LAB — HEPATIC FUNCTION PANEL
ALK PHOS: 46 U/L (ref 38–126)
ALT: 22 U/L (ref 0–44)
ALT: 68 U/L — AB (ref 0–44)
AST: 19 U/L (ref 15–41)
AST: 147 U/L — AB (ref 15–41)
Albumin: 3.4 g/dL — ABNORMAL LOW (ref 3.5–5.0)
Albumin: 3.7 g/dL (ref 3.5–5.0)
Alkaline Phosphatase: 163 U/L — ABNORMAL HIGH (ref 38–126)
BILIRUBIN DIRECT: 0.1 mg/dL (ref 0.0–0.2)
BILIRUBIN DIRECT: 0.4 mg/dL — AB (ref 0.0–0.2)
BILIRUBIN INDIRECT: 0.6 mg/dL (ref 0.3–0.9)
BILIRUBIN TOTAL: 0.7 mg/dL (ref 0.3–1.2)
BILIRUBIN TOTAL: 1 mg/dL (ref 0.3–1.2)
Indirect Bilirubin: 0.6 mg/dL (ref 0.3–0.9)
Total Protein: 6.3 g/dL — ABNORMAL LOW (ref 6.5–8.1)
Total Protein: 6.7 g/dL (ref 6.5–8.1)

## 2018-06-26 LAB — MAGNESIUM: Magnesium: 1.8 mg/dL (ref 1.7–2.4)

## 2018-06-26 LAB — CBC WITH DIFFERENTIAL/PLATELET
BASOS ABS: 0 10*3/uL (ref 0–0.1)
Basophils Absolute: 0 10*3/uL (ref 0–0.1)
Basophils Relative: 0 %
Basophils Relative: 0 %
EOS PCT: 0 %
Eosinophils Absolute: 0 10*3/uL (ref 0–0.7)
Eosinophils Absolute: 0 10*3/uL (ref 0–0.7)
Eosinophils Relative: 0 %
HEMATOCRIT: 37.2 % (ref 35.0–47.0)
HEMATOCRIT: 39 % (ref 35.0–47.0)
HEMOGLOBIN: 12.8 g/dL (ref 12.0–16.0)
Hemoglobin: 12.5 g/dL (ref 12.0–16.0)
LYMPHS ABS: 0.9 10*3/uL — AB (ref 1.0–3.6)
LYMPHS PCT: 7 %
LYMPHS PCT: 8 %
Lymphs Abs: 1 10*3/uL (ref 1.0–3.6)
MCH: 28.3 pg (ref 26.0–34.0)
MCH: 27.6 pg (ref 26.0–34.0)
MCHC: 33.7 g/dL (ref 32.0–36.0)
MCHC: 32.9 g/dL (ref 32.0–36.0)
MCV: 83.9 fL (ref 80.0–100.0)
MCV: 84 fL (ref 80.0–100.0)
Monocytes Absolute: 0.9 10*3/uL (ref 0.2–0.9)
Monocytes Absolute: 0.6 10*3/uL (ref 0.2–0.9)
Monocytes Relative: 7 %
Monocytes Relative: 5 %
NEUTROS ABS: 11.3 10*3/uL — AB (ref 1.4–6.5)
NEUTROS ABS: 10.5 10*3/uL — AB (ref 1.4–6.5)
Neutrophils Relative %: 86 %
Neutrophils Relative %: 87 %
PLATELETS: 228 10*3/uL (ref 150–440)
Platelets: 261 10*3/uL (ref 150–440)
RBC: 4.43 MIL/uL (ref 3.80–5.20)
RBC: 4.64 MIL/uL (ref 3.80–5.20)
RDW: 18.3 % — ABNORMAL HIGH (ref 11.5–14.5)
RDW: 18.7 % — ABNORMAL HIGH (ref 11.5–14.5)
WBC: 13.1 10*3/uL — AB (ref 3.6–11.0)
WBC: 12.1 10*3/uL — AB (ref 3.6–11.0)

## 2018-06-26 LAB — BASIC METABOLIC PANEL
ANION GAP: 7 (ref 5–15)
ANION GAP: 5 (ref 5–15)
BUN: 7 mg/dL (ref 6–20)
BUN: 9 mg/dL (ref 6–20)
CALCIUM: 8.2 mg/dL — AB (ref 8.9–10.3)
CO2: 25 mmol/L (ref 22–32)
CO2: 26 mmol/L (ref 22–32)
CREATININE: 0.6 mg/dL (ref 0.44–1.00)
Calcium: 8.3 mg/dL — ABNORMAL LOW (ref 8.9–10.3)
Chloride: 106 mmol/L (ref 98–111)
Chloride: 108 mmol/L (ref 98–111)
Creatinine, Ser: 0.54 mg/dL (ref 0.44–1.00)
GFR calc Af Amer: >60 mL/min (ref >60)
GFR calc Af Amer: >60 mL/min (ref >60)
GFR calc non Af Amer: >60 mL/min (ref >60)
GLUCOSE: 101 mg/dL — AB (ref 70–99)
GLUCOSE: 113 mg/dL — AB (ref 70–99)
POTASSIUM: 4.1 mmol/L (ref 3.5–5.1)
Potassium: 4.2 mmol/L (ref 3.5–5.1)
Sodium: 138 mmol/L (ref 135–145)
Sodium: 139 mmol/L (ref 135–145)

## 2018-06-26 LAB — PHOSPHORUS: Phosphorus: 2.6 mg/dL (ref 2.5–4.6)

## 2018-06-26 MED ORDER — LEVOFLOXACIN IN D5W 500 MG/100ML IV SOLN
500.0000 mg | INTRAVENOUS | Status: DC
  Administered 2018-06-26: 500 mg via INTRAVENOUS
  Filled 2018-06-26 (×2): qty 100

## 2018-06-26 MED ORDER — HYDROMORPHONE HCL 1 MG/ML IJ SOLN
1.0000 mg | INTRAMUSCULAR | Status: DC | PRN
  Administered 2018-06-26: 1 mg via INTRAVENOUS
  Filled 2018-06-26: qty 1

## 2018-06-26 MED ORDER — IBUPROFEN 400 MG PO TABS
800.0000 mg | ORAL_TABLET | Freq: Four times a day (QID) | ORAL | Status: DC | PRN
  Administered 2018-06-27: 800 mg via ORAL
  Filled 2018-06-26: qty 2

## 2018-06-26 MED ORDER — HYDROMORPHONE HCL 1 MG/ML IJ SOLN: 1.0000 mg | INTRAMUSCULAR | Status: DC | PRN

## 2018-06-26 MED ORDER — SIMETHICONE 80 MG PO CHEW
80.0000 mg | CHEWABLE_TABLET | Freq: Once | ORAL | Status: AC
  Administered 2018-06-26: 80 mg via ORAL
  Filled 2018-06-26: qty 1

## 2018-06-26 MED ORDER — ENOXAPARIN SODIUM 40 MG/0.4ML ~~LOC~~ SOLN
40.0000 mg | SUBCUTANEOUS | Status: DC
  Administered 2018-06-26 – 2018-06-27 (×2): 40 mg via SUBCUTANEOUS
  Filled 2018-06-26 (×2): qty 0.4

## 2018-06-26 MED ORDER — HYDROCODONE-ACETAMINOPHEN 5-325 MG PO TABS
1.0000 | ORAL_TABLET | Freq: Four times a day (QID) | ORAL | Status: DC | PRN
  Administered 2018-06-26 – 2018-06-27 (×2): 2 via ORAL
  Filled 2018-06-26 (×3): qty 2

## 2018-06-26 MED ORDER — DOCUSATE SODIUM 100 MG PO CAPS
100.0000 mg | ORAL_CAPSULE | Freq: Two times a day (BID) | ORAL | Status: DC
  Administered 2018-06-27: 100 mg via ORAL
  Filled 2018-06-26: qty 1

## 2018-06-26 NOTE — Progress Notes (Signed)
Initial Nutrition Assessment  DOCUMENTATION CODES:   Not applicable   (will attempt nutrition-focused physical exam at follow-up as pt declined today due to pain)  INTERVENTION:   - Once diet advanced, recommend Boost Breeze po TID, each supplement provides 250 kcal and 9 grams of protein  NUTRITION DIAGNOSIS:   Inadequate oral intake related to inability to eat as evidenced by NPO status.  GOAL:   Patient will meet greater than or equal to 90% of their needs  MONITOR:   Diet advancement, Labs, I & O's, Weight trends  REASON FOR ASSESSMENT:   Malnutrition Screening Tool    ASSESSMENT:   37 year old female who presented to the ED with abdominal pain. Pt is s/p recent delivery. Pt with gallbladder issues in the past including ERCP and scheduled lap chole that was cancelled.  8/11 - s/p laparoscopic cholecystectomy  Spoke with pt at bedside who reports discomfort due to pain.  Pt states that she would like to try something to eat as she is ready to go home. Pt reports delivering a baby girl approximately 8 weeks PTA and that she needs to get home to her children. RD explained that MD will assess pt and advance diet if appropriate. Pt expresses understanding and is agreeable to trying an oral nutrition supplement after diet advanced.  Pt reports that her PO intake for the past week PTA was "not good" because she was "scared to eat" due to having persistent "gallbladder attacks." Pt explains a gallbladder attack as intense pain. Pt states that over the past week, she may have small amounts of fruits or vegetables but did not eat much else throughout the day. Pt states that when she is feeling well and not having gallbladder attacks, she eats well and has 2-3 meals daily.  Pt endorses recent weight loss and believes it is due in part to poor appetite and PO intake. Pt also reports that she has been trying to lose weight since delivering her baby 8 weeks ago. Pt denies having a  typical body weight but states that she maintains around 160 lbs when she is not pregnant. Per weight history in chart, pt has lost approximately 15 lbs since 04/17/18. This is a 7.8% weight loss in less than 3 months which is significant for timeframe but likely related to recent delivery.  Medications and labs reviewed.  NUTRITION - FOCUSED PHYSICAL EXAM:  Pt declined due to pain.  Diet Order:   Diet Order            Diet NPO time specified  Diet effective now              EDUCATION NEEDS:   No education needs have been identified at this time  Skin:  Skin Assessment: Reviewed RN Assessment  Last BM:  06/24/18  Height:   Ht Readings from Last 1 Encounters:  06/24/18 5\' 9"  (1.753 m)    Weight:   Wt Readings from Last 1 Encounters:  06/24/18 79 kg    Ideal Body Weight:  65.91 kg  BMI:  Body mass index is 25.71 kg/m.  Estimated Nutritional Needs:   Kcal:  2000-2200 kcal  Protein:  100-115 grams  Fluid:  2.0-2.2 L    Gwendolyn ReadingKate Glenn Gwendolyn Vanvorst, MS, RD, LDN Pager: [phone] Weekend/After Hours: [phone]

## 2018-06-26 NOTE — Progress Notes (Signed)
Subjective:    Gwendolyn Glenn is a 37 y.o. female  Hospital stay day 2, 1 Day Post-Op lap chole  Doing well.  Sore in RUQ but different kind of pain now prior to pre-op.  Tolerating CLD.   Objective:      Temp:  [97.8 F (36.6 C)-99.6 F (37.6 C)] 97.8 F (36.6 C) (08/12 1212) Pulse Rate:  [68-92] 80 (08/12 1832) Resp:  [11-22] 22 (08/12 1832) BP: (101-151)/(66-103) 151/103 (08/12 1832) SpO2:  [95 %-100 %] 100 % (08/12 1832)     Height: 5\' 9"  (175.3 cm) Weight: 79 kg BMI (Calculated): 25.7   Intake/Output this shift:  Total I/O In: 2158 [P.O.:1079; I.V.:1079] Out: 900 [Urine:900]       Constitutional :  alert, cooperative, appears stated age and no distress  Respiratory:  clear to auscultation bilaterally  Cardiovascular:  regular rate and rhythm, S1, S2 normal, no murmur, click, rub or gallop  Gastrointestinal: soft, incisions c/d/i.  tenderness noted in RUQ, but no guarding..   Skin: Cool and moist.   Psychiatric: Normal affect, non-agitated, not confused       LABS:  CMP Latest Ref Rng & Units 06/26/2018 06/25/2018 06/24/2018  Glucose 70 - 99 mg/dL 161(W101(H) 960(A105(H) 98  BUN 6 - 20 mg/dL 7 6 10   Creatinine 0.44 - 1.00 mg/dL 5.400.60 9.810.66 1.910.79  Sodium 135 - 145 mmol/L 138 141 141  Potassium 3.5 - 5.1 mmol/L 4.2 3.8 4.4  Chloride 98 - 111 mmol/L 106 107 106  CO2 22 - 32 mmol/L 25 25 28   Calcium 8.9 - 10.3 mg/dL 8.2(L) 8.6(L) 8.9  Total Protein 6.5 - 8.1 g/dL 6.3(L) 7.2 7.5  Total Bilirubin 0.3 - 1.2 mg/dL 0.7 0.8 0.7  Alkaline Phos 38 - 126 U/L 46 55 60  AST 15 - 41 U/L 19 20 24   ALT 0 - 44 U/L 22 25 30    CBC Latest Ref Rng & Units 06/26/2018 06/25/2018 06/24/2018  WBC 3.6 - 11.0 K/uL 13.1(H) 16.3(H) 9.8  Hemoglobin 12.0 - 16.0 g/dL 47.812.5 29.514.6 62.114.3  Hematocrit 35.0 - 47.0 % 37.2 43.8 41.2  Platelets 150 - 440 K/uL 228 270 309    RADS: n/a Assessment:  Status post lap chole.  White count still remains high so we will keep for additional day just to ensure continues to  decrease.  Will provide 1 more dose of antibiotic for severe chronic cholecystitis.  Will advance diet to regular diet as well to ensure she tolerates prior to discharge.  Update: Urgently called to bedside by room nurse about an hour after initial encounter today and patient noted to be in excruciating pain in the right upper quadrant radiating to the epigastric area states it is worse gallbladder pain she is ever experiencing it.  She states this had started shortly after eating her regular food.  Dilaudid was ordered and pain started to subside physical exam wise vitals are stable she is still sore in the right upper quadrant and epigastric area but no specific guarding.  Due to the extreme nature of the painful attack she has shortly after eating we will repeat labs and reorder a right upper quadrant ultrasound to ensure that cystic artery and cystic stump are still intact that there is no injury to the duct systems.  We will continue to monitor closely

## 2018-06-27 LAB — CBC WITH DIFFERENTIAL/PLATELET
Basophils Absolute: 0 10*3/uL (ref 0–0.1)
Basophils Relative: 0 %
Eosinophils Absolute: 0.1 10*3/uL (ref 0–0.7)
Eosinophils Relative: 1 %
HEMATOCRIT: 36.9 % (ref 35.0–47.0)
Hemoglobin: 12.3 g/dL (ref 12.0–16.0)
LYMPHS ABS: 1.2 10*3/uL (ref 1.0–3.6)
LYMPHS PCT: 16 %
MCH: 28.3 pg (ref 26.0–34.0)
MCHC: 33.4 g/dL (ref 32.0–36.0)
MCV: 84.7 fL (ref 80.0–100.0)
MONOS PCT: 8 %
Monocytes Absolute: 0.6 10*3/uL (ref 0.2–0.9)
NEUTROS ABS: 5.3 10*3/uL (ref 1.4–6.5)
Neutrophils Relative %: 75 %
Platelets: 207 10*3/uL (ref 150–440)
RBC: 4.36 MIL/uL (ref 3.80–5.20)
RDW: 18.4 % — ABNORMAL HIGH (ref 11.5–14.5)
WBC: 7.1 10*3/uL (ref 3.6–11.0)

## 2018-06-27 LAB — PHOSPHORUS: Phosphorus: 2.1 mg/dL — ABNORMAL LOW (ref 2.5–4.6)

## 2018-06-27 LAB — BASIC METABOLIC PANEL
ANION GAP: 4 — AB (ref 5–15)
BUN: 7 mg/dL (ref 6–20)
CO2: 26 mmol/L (ref 22–32)
CREATININE: 0.61 mg/dL (ref 0.44–1.00)
Calcium: 8.2 mg/dL — ABNORMAL LOW (ref 8.9–10.3)
Chloride: 110 mmol/L (ref 98–111)
GFR calc Af Amer: >60 mL/min (ref >60)
GFR calc non Af Amer: >60 mL/min (ref >60)
GLUCOSE: 104 mg/dL — AB (ref 70–99)
Potassium: 4.5 mmol/L (ref 3.5–5.1)
Sodium: 140 mmol/L (ref 135–145)

## 2018-06-27 LAB — HIV ANTIBODY (ROUTINE TESTING W REFLEX): HIV Screen 4th Generation wRfx: NONREACTIVE

## 2018-06-27 LAB — MAGNESIUM: Magnesium: 2 mg/dL (ref 1.7–2.4)

## 2018-06-27 LAB — SURGICAL PATHOLOGY

## 2018-06-27 MED ORDER — DOCUSATE SODIUM 100 MG PO CAPS: 100.0000 mg | ORAL_CAPSULE | Freq: Two times a day (BID) | ORAL | 0 refills | Status: AC | PRN

## 2018-06-27 MED ORDER — HYDROCODONE-ACETAMINOPHEN 5-325 MG PO TABS: 1.0000 | ORAL_TABLET | Freq: Four times a day (QID) | ORAL | 0 refills | Status: AC | PRN

## 2018-06-27 MED ORDER — ONDANSETRON 4 MG PO TBDP: 4.0000 mg | ORAL_TABLET | Freq: Four times a day (QID) | ORAL | 0 refills | Status: AC | PRN

## 2018-06-27 MED ORDER — IBUPROFEN 800 MG PO TABS: 800.0000 mg | ORAL_TABLET | Freq: Four times a day (QID) | ORAL | 0 refills | Status: AC | PRN

## 2018-06-27 NOTE — Progress Notes (Signed)
Discharge teaching given to patient, patient verbalized understanding and had no questions. Patient IV removed. Patient will be transported home by family. All patient belongings gathered prior to leaving.  

## 2018-06-27 NOTE — Discharge Summary (Signed)
Physician Discharge Summary  Patient ID: Gwendolyn Glenn MRN: [id number] DOB/AGE: Mar 20, 1981 36 y.o.  Admit date: 06/24/2018 Discharge date: 06/27/2018  Admission Diagnoses: ACUTE CHOLECYSTITIS  Discharge Diagnoses:  Chronic cholecystitis  Discharged Condition: good  Hospital Course: Patient presented to ED with acute on chronic cholecystitis.  Patient was taken to the operating room for a very difficult lap chole hospital day 1.  Postop uneventful recovery until a trial of a low-fat regular diet where she experienced severe intractable abdominal pain shortly after completing a full meal.  Immediate work-up with repeat labs in a follow-up ultrasound revealed no obvious etiology for the pain itself subsided with 1 dose of Dilaudid.  Following morning patient was doing much better tolerating a full liquid diet and no recurrence of the pain itself.  Repeat labs in the morning also remained within normal limits with leukocytosis now resolved after continuation of antibiotics postop.  At this time patient is cleared for discharge home recommending a full liquid diet at home for an additional 2 to 3 days.  Consults: None  Discharge Exam: Blood pressure (!) 93/58, pulse (!) 57, temperature 97.9 F (36.6 C), temperature source Oral, resp. rate 16, height 5\' 9"  (1.753 m), weight 79 kg, last menstrual period 05/26/2018, SpO2 95 %, not currently breastfeeding. General appearance: alert, cooperative and no distress GI: soft, non-tender; bowel sounds normal; no masses,  no organomegaly Incision/Wound: port sites c/d/i.  minimal tenderness at incision sites  Disposition:  Discharge disposition: 01-Home or Self Care       Discharge Instructions    Discharge patient   Complete by:  As directed    Discharge disposition:  01-Home or Self Care   Discharge patient date:  06/27/2018     Allergies as of 06/27/2018      Reactions   Penicillins Hives   Has patient had a PCN reaction causing  immediate rash, facial/tongue/throat swelling, SOB or lightheadedness with hypotension: Unknown Has patient had a PCN reaction causing severe rash involving mucus membranes or skin necrosis: No Has patient had a PCN reaction that required hospitalization: No Has patient had a PCN reaction occurring within the last 10 years: No If all of the above answers are "NO", then may proceed with Cephalosporin use.   Sulfa Antibiotics Swelling   Childhood allergy   Latex Rash      Medication List    TAKE these medications   docusate sodium 100 MG capsule Commonly known as:  COLACE Take 1 capsule (100 mg total) by mouth 2 (two) times daily as needed for mild constipation.   HYDROcodone-acetaminophen 5-325 MG tablet Commonly known as:  NORCO/VICODIN Take 1-2 tablets by mouth every 6 (six) hours as needed for moderate pain.   ibuprofen 800 MG tablet Commonly known as:  ADVIL,MOTRIN Take 1 tablet (800 mg total) by mouth every 6 (six) hours as needed for mild pain. What changed:    medication strength  how much to take  when to take this  reasons to take this   ondansetron 4 MG disintegrating tablet Commonly known as:  ZOFRAN-ODT Take 1 tablet (4 mg total) by mouth every 6 (six) hours as needed for nausea.      Follow-up Information    Tonna BoehringerSakai, Tammi Boulier, DO Follow up in 2 week(s).   Specialty:  Surgery Contact information: [address] [place] [id number] [phone]            Total time spent arranging discharge was >3330min. Signed: Sung Amabilesami Arthi Mcdonald 06/27/2018, 7:27  AM

## 2019-05-19 IMAGING — US US ABDOMEN LIMITED
1 series · 14 of 25 positions shown · non-contrast
Comparison: 02/28/2017.

CLINICAL DATA: Right upper quadrant abdominal pain for the past
week.

EXAM:
ULTRASOUND ABDOMEN LIMITED RIGHT UPPER QUADRANT

[Series 1: us abdomen limited · 14 of 42 slices shown]
[im 1/42]
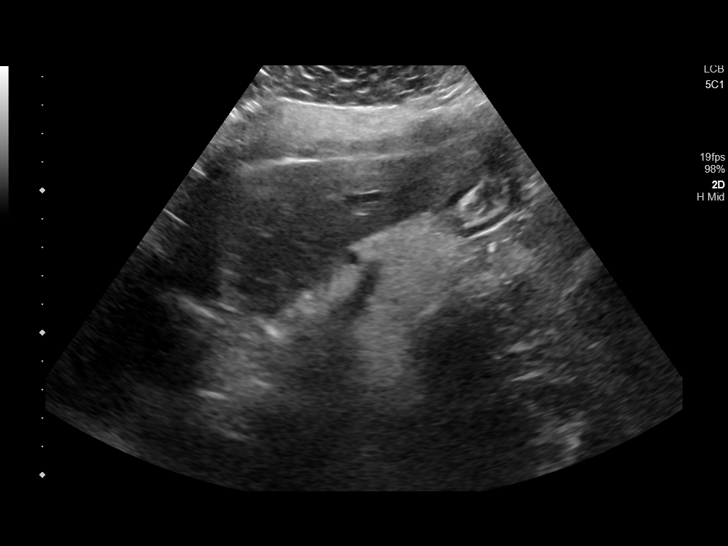
[im 4/42]
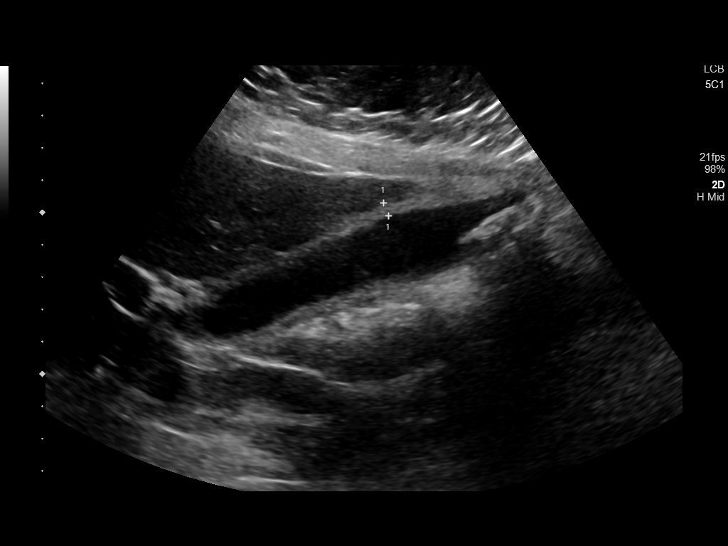
[im 7/42]
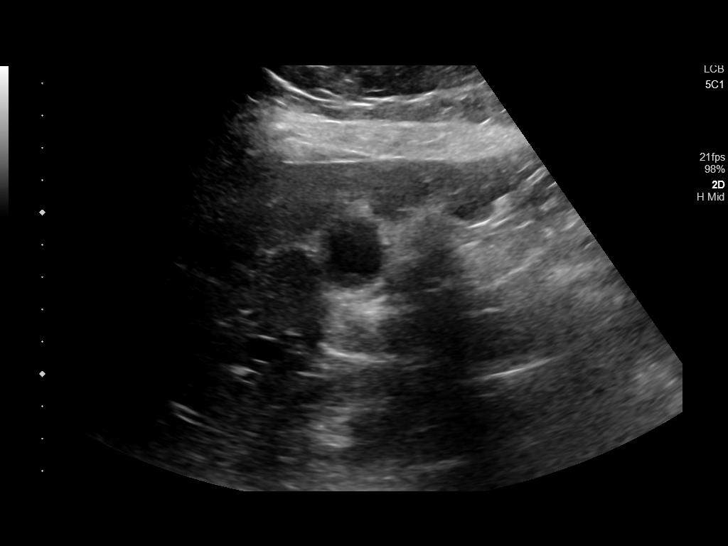
[im 11/42]
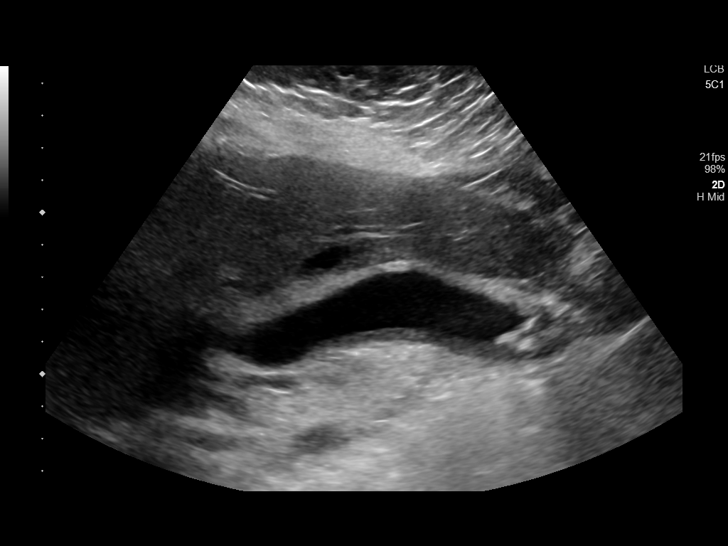
[im 14/42]
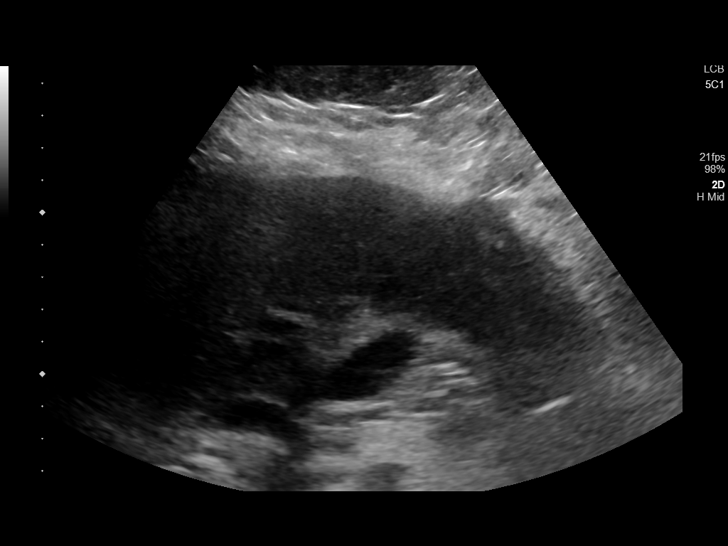
[im 16/42]
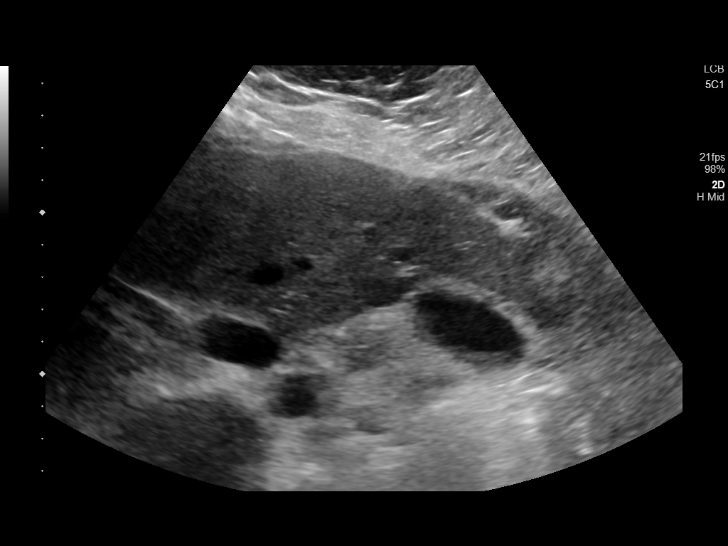
[im 19/42]
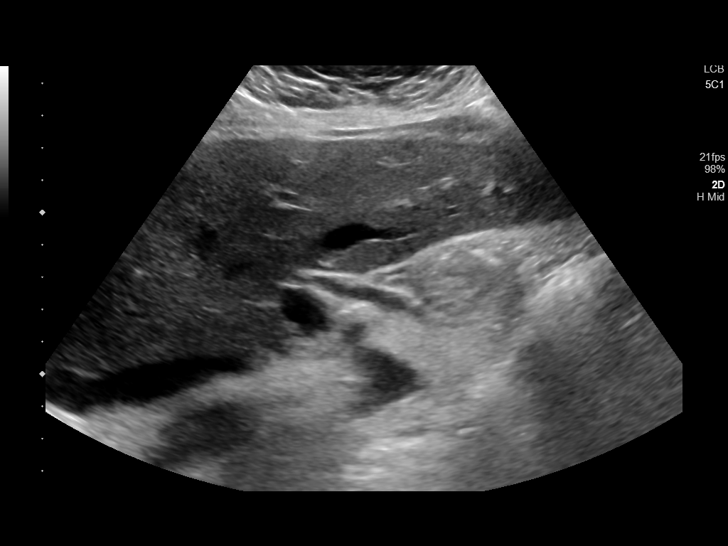
[im 23/42]
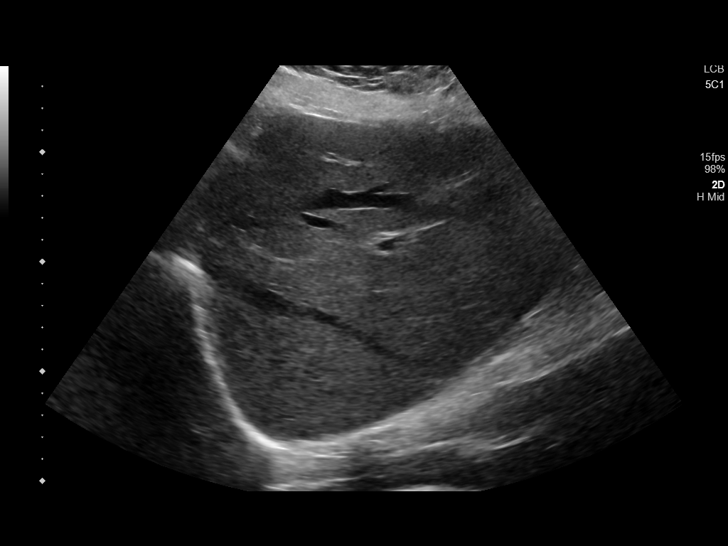
[im 26/42]
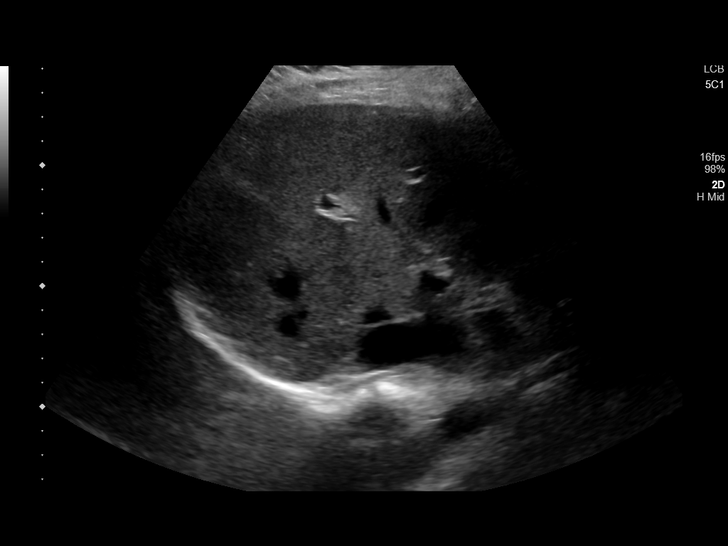
[im 28/42]
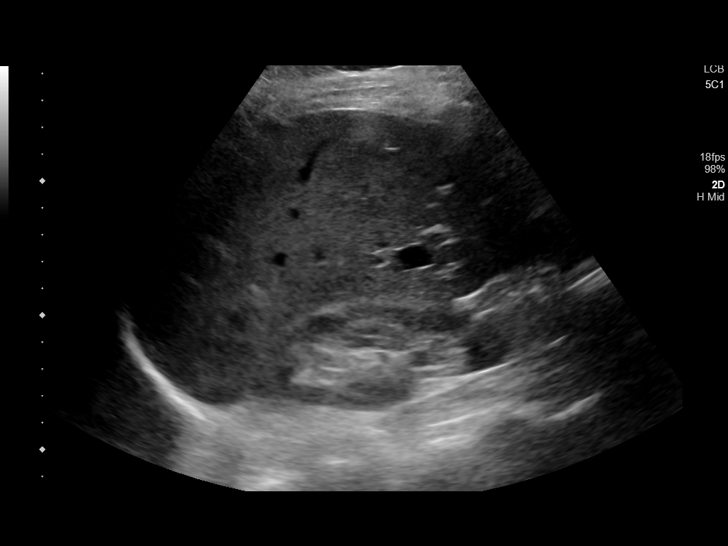
[im 31/42]
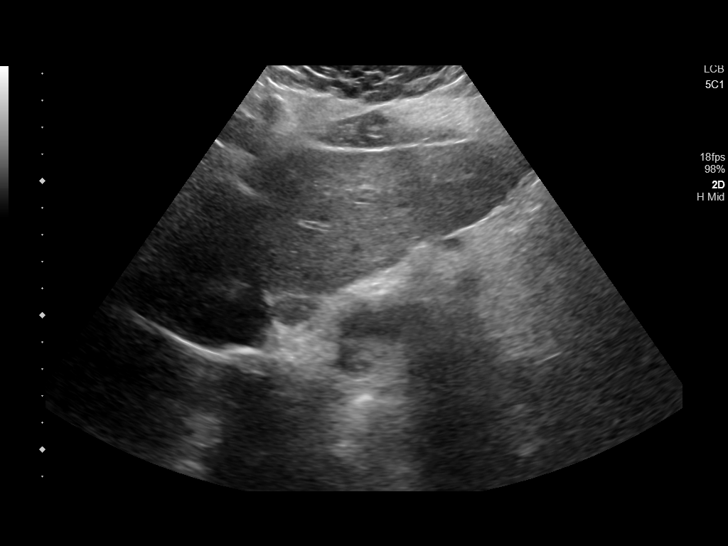
[im 35/42]
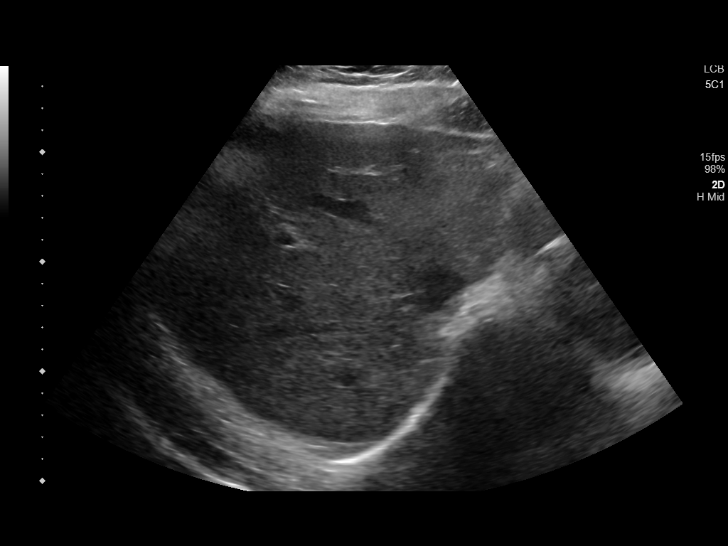
[im 38/42]
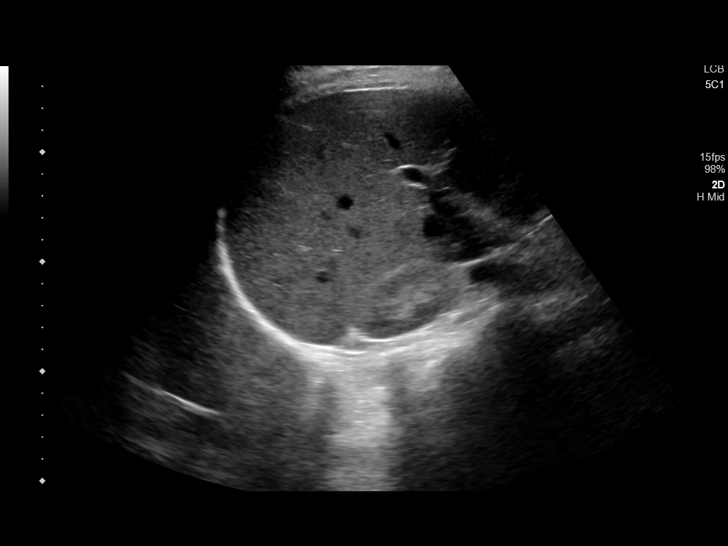
[im 42/42]
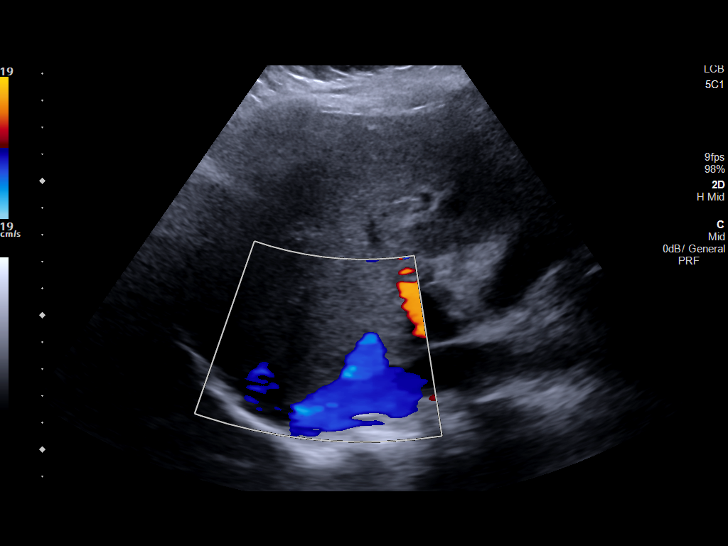

[14 of 25 positions shown; findings below may reference images not displayed]

FINDINGS: Gallbladder:

Again demonstrated are multiple gallstones in the gallbladder,
measuring up to 9 mm in maximum diameter each. Diffuse gallbladder
wall thickening with a maximum thickness of 4.3 mm. No
pericholecystic fluid. Positive sonographic Murphy sign.

Common bile duct:

Diameter: 6.4 mm.

Liver:

No focal lesion identified. Within normal limits in parenchymal
echogenicity. Portal vein is patent on color Doppler imaging with
normal direction of blood flow towards the liver.
IMPRESSION: 1. Cholelithiasis, diffuse gallbladder wall thickening and positive
sonographic Murphy sign. This combination of findings is compatible
with a diagnosis of acute cholecystitis.
2. Mildly dilated common duct. This could be an indication of a
nonvisualized distal common duct stone.

## 2019-05-21 IMAGING — US US ABDOMEN LIMITED
1 series · 14 of 25 positions shown · non-contrast
Comparison: Ultrasound 06/24/2018

CLINICAL DATA: Postprandial right upper quadrant pain,
cholecystectomy 06/25/2018

EXAM:
ULTRASOUND ABDOMEN LIMITED RIGHT UPPER QUADRANT

[Series 1: us abdomen limited · 14 of 35 slices shown]
[im 1/35]
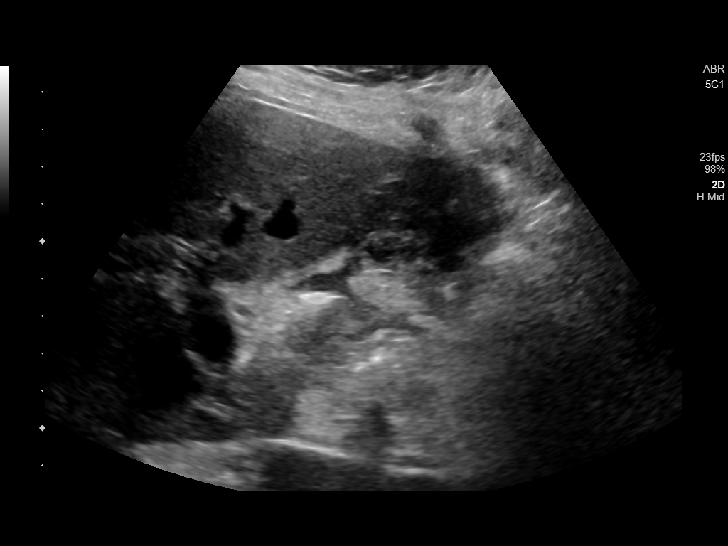
[im 3/35]
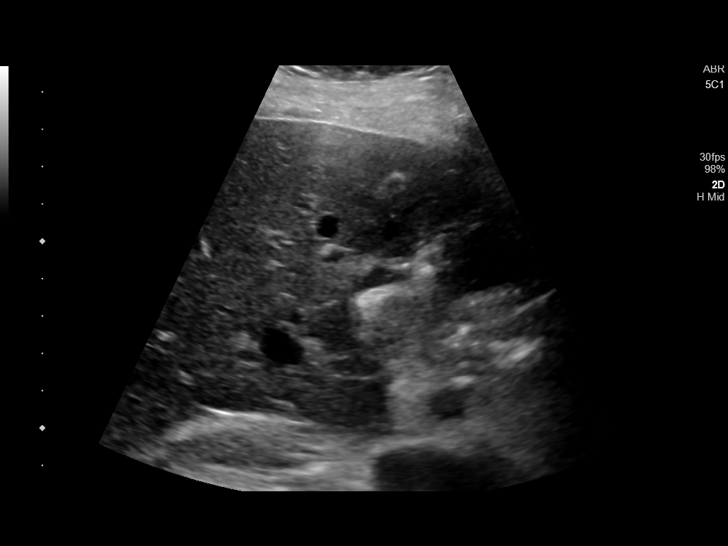
[im 6/35]
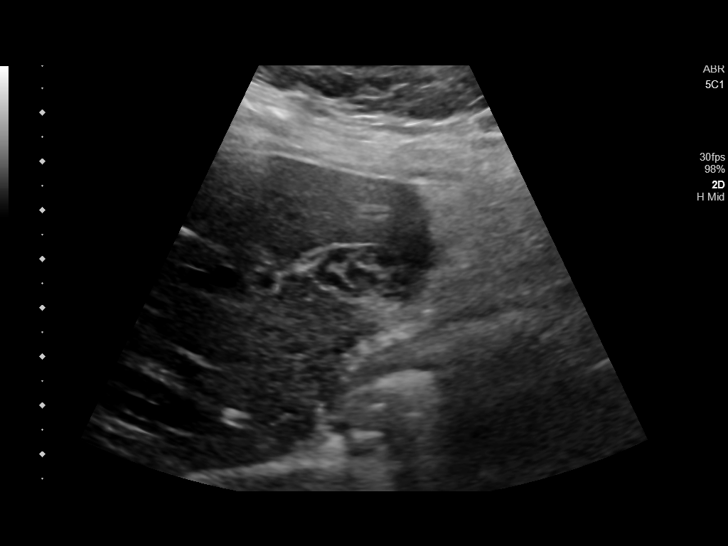
[im 9/35]
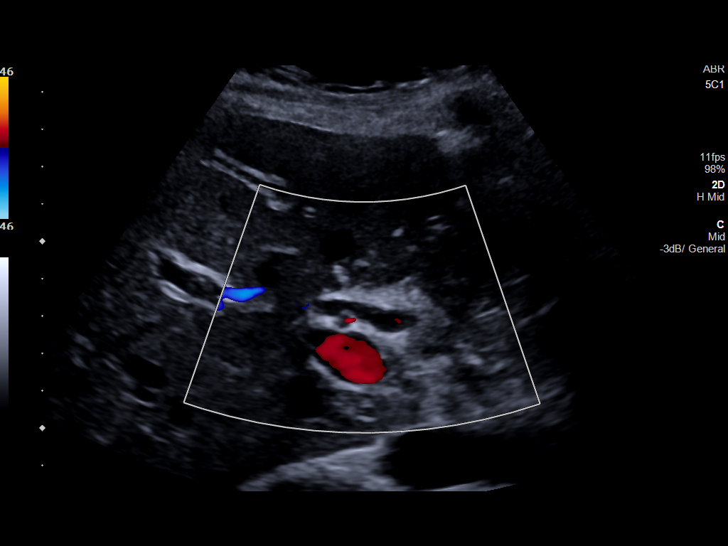
[im 12/35]
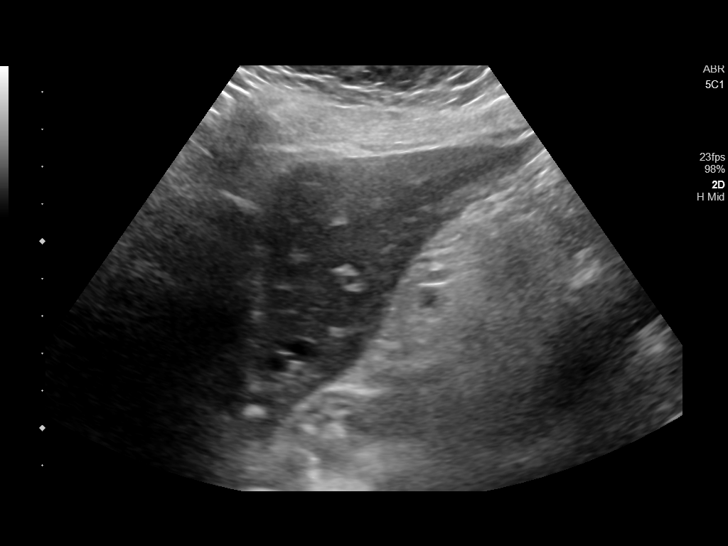
[im 13/35]
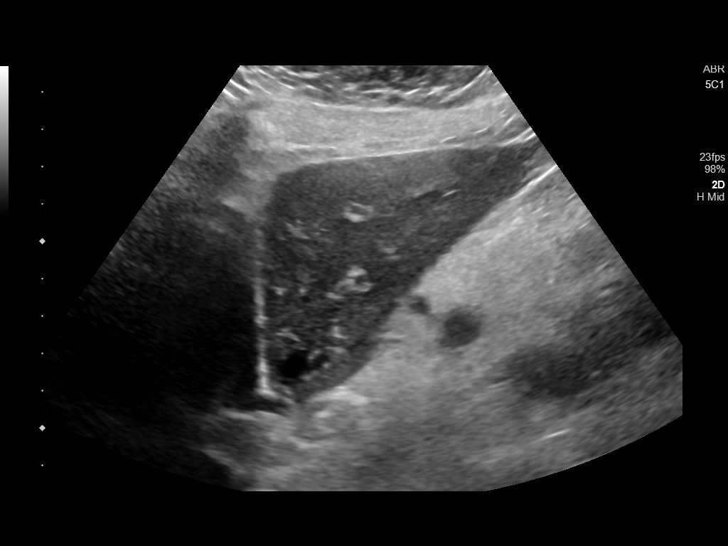
[im 16/35]
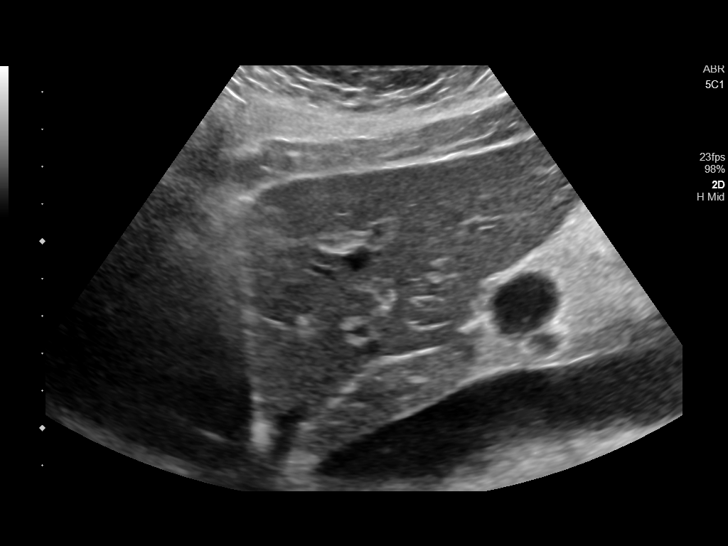
[im 19/35]
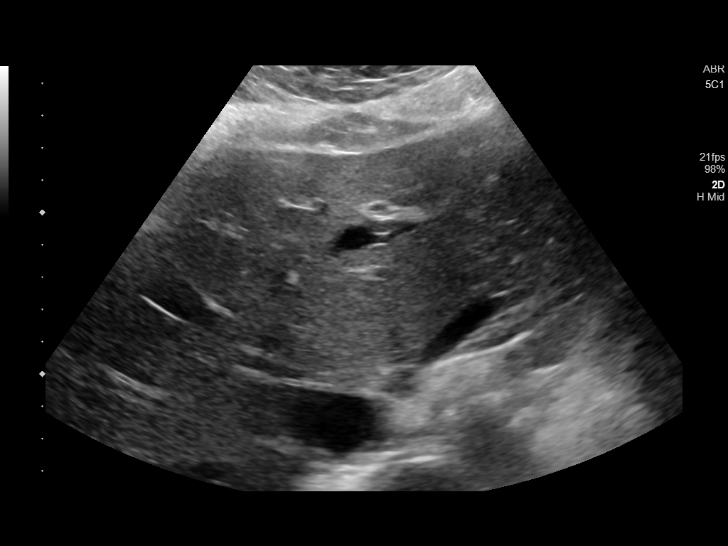
[im 22/35]
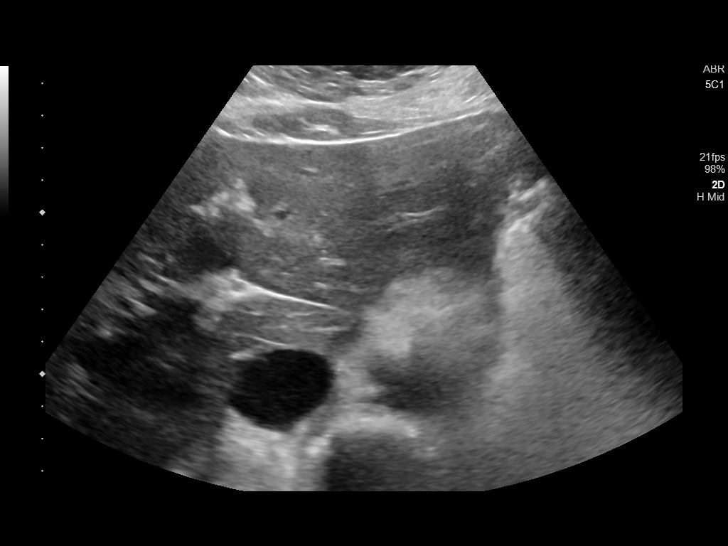
[im 23/35]
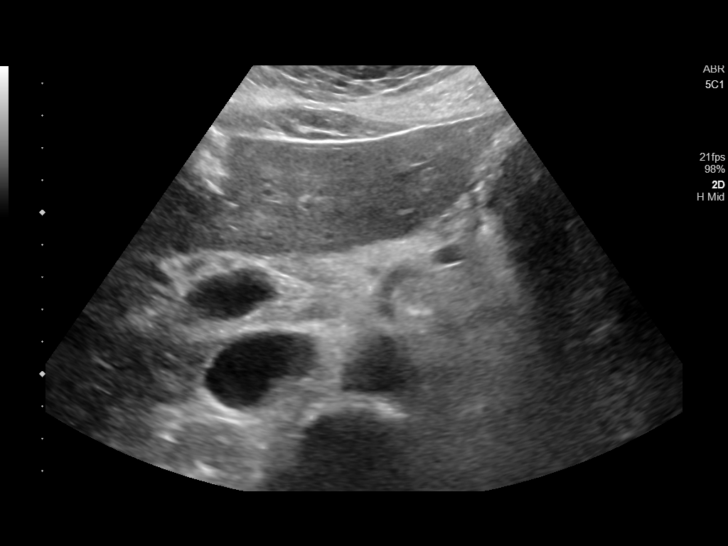
[im 26/35]
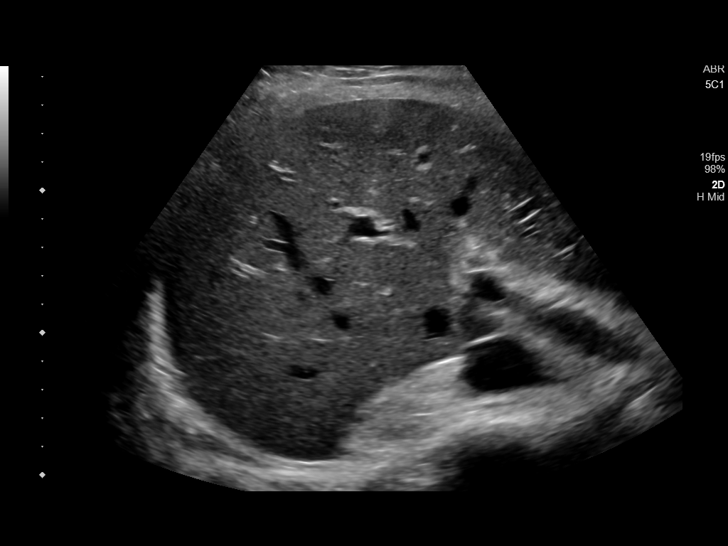
[im 29/35]
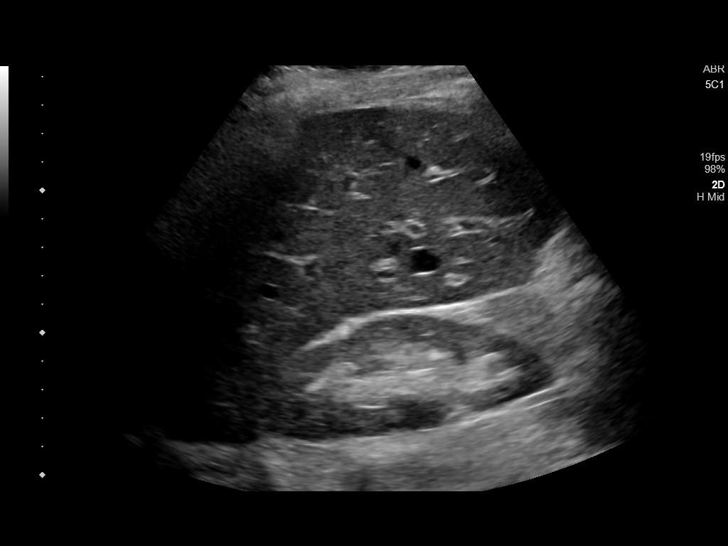
[im 32/35]
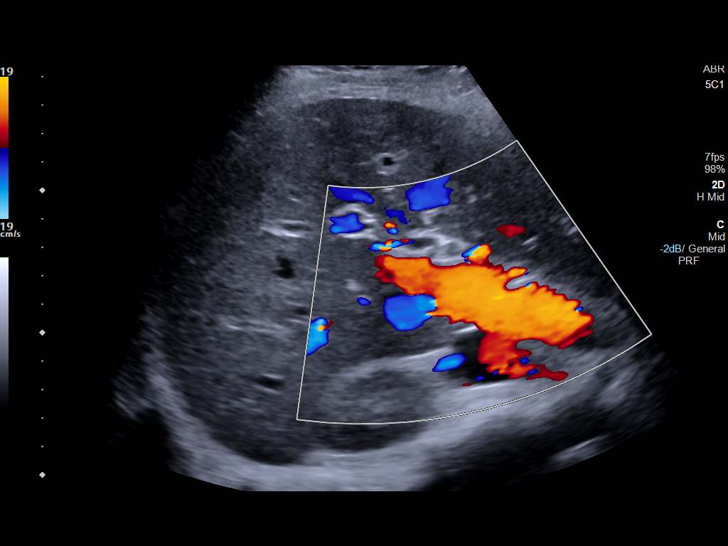
[im 35/35]
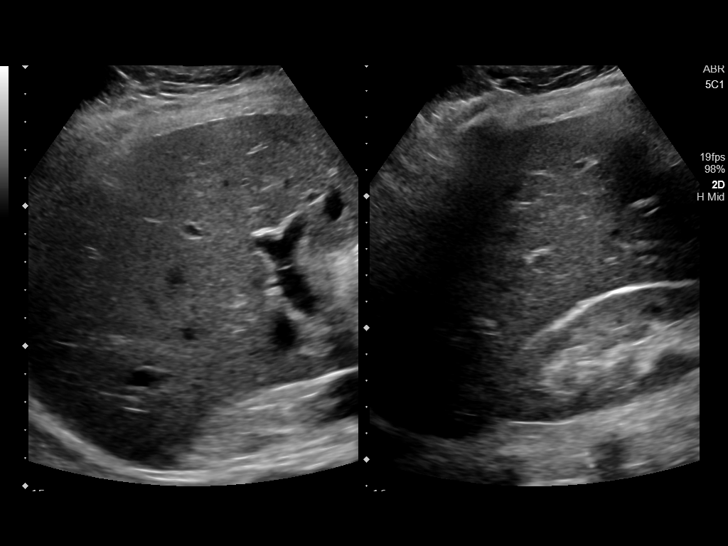

[14 of 25 positions shown; findings below may reference images not displayed]

FINDINGS: Gallbladder:

Status post cholecystectomy. Small complex fluid collection
measuring 3 x 1.4 x 2.2 cm.

Common bile duct:

Diameter: 4 mm

Liver:

No focal lesion identified. Within normal limits in parenchymal
echogenicity. Portal vein is patent on color Doppler imaging with
normal direction of blood flow towards the liver.
IMPRESSION: Status post cholecystectomy. Small 3 cm complex fluid collection in
the gallbladder fossa, may represent postoperative collection. No
biliary enlargement.

## 2022-03-08 ENCOUNTER — Ambulatory Visit: Payer: Medicaid Other | Admitting: Neurology

## 2022-03-08 ENCOUNTER — Encounter: Payer: Self-pay | Admitting: Neurology
